# Patient Record
Sex: Female | Born: 1970 | Race: Black or African American | Hispanic: No | Marital: Single | Smoking: Never smoker
Health system: Southern US, Community
[De-identification: ages and names within clinical notes are randomized; demographics above are authoritative.]

## PROBLEM LIST (undated history)

## (undated) DIAGNOSIS — I10 Essential (primary) hypertension: Secondary | ICD-10-CM

## (undated) HISTORY — DX: Essential (primary) hypertension: I10

---

## 1999-03-25 ENCOUNTER — Emergency Department (HOSPITAL_COMMUNITY): Admission: EM | Admit: 1999-03-25 | Discharge: 1999-03-25 | Payer: Self-pay

## 1999-03-25 ENCOUNTER — Encounter: Payer: Self-pay | Admitting: Emergency Medicine

## 2009-02-28 ENCOUNTER — Ambulatory Visit: Payer: Self-pay | Admitting: Family Medicine

## 2010-06-26 ENCOUNTER — Ambulatory Visit: Payer: Self-pay | Admitting: Family Medicine

## 2011-05-28 ENCOUNTER — Ambulatory Visit: Payer: Self-pay | Admitting: Gastroenterology

## 2012-03-10 ENCOUNTER — Ambulatory Visit: Payer: Self-pay | Admitting: Family Medicine

## 2013-08-05 ENCOUNTER — Ambulatory Visit: Payer: Self-pay | Admitting: Family Medicine

## 2013-08-05 LAB — HM MAMMOGRAPHY

## 2014-09-14 DIAGNOSIS — K2100 Gastro-esophageal reflux disease with esophagitis, without bleeding: Secondary | ICD-10-CM | POA: Insufficient documentation

## 2014-09-14 DIAGNOSIS — R51 Headache: Secondary | ICD-10-CM

## 2014-09-14 DIAGNOSIS — R519 Headache, unspecified: Secondary | ICD-10-CM | POA: Insufficient documentation

## 2014-09-14 DIAGNOSIS — F32A Depression, unspecified: Secondary | ICD-10-CM | POA: Insufficient documentation

## 2014-09-14 DIAGNOSIS — Z800 Family history of malignant neoplasm of digestive organs: Secondary | ICD-10-CM | POA: Insufficient documentation

## 2014-09-14 DIAGNOSIS — N912 Amenorrhea, unspecified: Secondary | ICD-10-CM | POA: Insufficient documentation

## 2014-09-14 DIAGNOSIS — B001 Herpesviral vesicular dermatitis: Secondary | ICD-10-CM | POA: Insufficient documentation

## 2014-09-14 DIAGNOSIS — R635 Abnormal weight gain: Secondary | ICD-10-CM | POA: Insufficient documentation

## 2014-09-14 DIAGNOSIS — R7303 Prediabetes: Secondary | ICD-10-CM | POA: Insufficient documentation

## 2014-09-14 DIAGNOSIS — E669 Obesity, unspecified: Secondary | ICD-10-CM | POA: Insufficient documentation

## 2014-09-14 DIAGNOSIS — R739 Hyperglycemia, unspecified: Secondary | ICD-10-CM | POA: Insufficient documentation

## 2014-09-14 DIAGNOSIS — N951 Menopausal and female climacteric states: Secondary | ICD-10-CM | POA: Insufficient documentation

## 2014-09-14 DIAGNOSIS — F419 Anxiety disorder, unspecified: Secondary | ICD-10-CM | POA: Insufficient documentation

## 2014-09-14 DIAGNOSIS — R61 Generalized hyperhidrosis: Secondary | ICD-10-CM | POA: Insufficient documentation

## 2014-09-14 DIAGNOSIS — R002 Palpitations: Secondary | ICD-10-CM | POA: Insufficient documentation

## 2014-09-14 DIAGNOSIS — A048 Other specified bacterial intestinal infections: Secondary | ICD-10-CM | POA: Insufficient documentation

## 2014-09-14 DIAGNOSIS — N62 Hypertrophy of breast: Secondary | ICD-10-CM | POA: Insufficient documentation

## 2014-09-14 DIAGNOSIS — F329 Major depressive disorder, single episode, unspecified: Secondary | ICD-10-CM | POA: Insufficient documentation

## 2014-09-24 ENCOUNTER — Encounter: Payer: Self-pay | Admitting: Physician Assistant

## 2014-09-24 ENCOUNTER — Ambulatory Visit (INDEPENDENT_AMBULATORY_CARE_PROVIDER_SITE_OTHER): Payer: 59 | Admitting: Physician Assistant

## 2014-09-24 VITALS — BP 140/78 | HR 70 | Temp 97.8°F | Resp 16 | Ht 62.0 in | Wt 195.8 lb

## 2014-09-24 DIAGNOSIS — Z01419 Encounter for gynecological examination (general) (routine) without abnormal findings: Secondary | ICD-10-CM

## 2014-09-24 DIAGNOSIS — Z713 Dietary counseling and surveillance: Secondary | ICD-10-CM

## 2014-09-24 DIAGNOSIS — Z1239 Encounter for other screening for malignant neoplasm of breast: Secondary | ICD-10-CM | POA: Diagnosis not present

## 2014-09-24 DIAGNOSIS — Z124 Encounter for screening for malignant neoplasm of cervix: Secondary | ICD-10-CM

## 2014-09-24 DIAGNOSIS — Z6835 Body mass index (BMI) 35.0-35.9, adult: Secondary | ICD-10-CM | POA: Insufficient documentation

## 2014-09-24 MED ORDER — LORCASERIN HCL 10 MG PO TABS: 1.0000 | ORAL_TABLET | Freq: Two times a day (BID) | ORAL | Status: DC

## 2014-09-24 NOTE — Patient Instructions (Addendum)
Preventive Care for Adults A healthy lifestyle and preventive care can promote health and wellness. Preventive health guidelines for women include the following key practices. 1. A routine yearly physical is a good way to check with your health care provider about your health and preventive screening. It is a chance to share any concerns and updates on your health and to receive a thorough exam. 2. Visit your dentist for a routine exam and preventive care every 6 months. Brush your teeth twice a day and floss once a day. Good oral hygiene prevents tooth decay and gum disease. 3. The frequency of eye exams is based on your age, health, family medical history, use of contact lenses, and other factors. Follow your health care provider's recommendations for frequency of eye exams. 4. Eat a healthy diet. Foods like vegetables, fruits, whole grains, low-fat dairy products, and lean protein foods contain the nutrients you need without too many calories. Decrease your intake of foods high in solid fats, added sugars, and salt. Eat the right amount of calories for you.Get information about a proper diet from your health care provider, if necessary. 5. Regular physical exercise is one of the most important things you can do for your health. Most adults should get at least 150 minutes of moderate-intensity exercise (any activity that increases your heart rate and causes you to sweat) each week. In addition, most adults need muscle-strengthening exercises on 2 or more days a week. 6. Maintain a healthy weight. The body mass index (BMI) is a screening tool to identify possible weight problems. It provides an estimate of body fat based on height and weight. Your health care provider can find your BMI and can help you achieve or maintain a healthy weight.For adults 20 years and older: 1. A BMI below 18.5 is considered underweight. 2. A BMI of 18.5 to 24.9 is normal. 3. A BMI of 25 to 29.9 is considered  overweight. 4. A BMI of 30 and above is considered obese. 7. Maintain normal blood lipids and cholesterol levels by exercising and minimizing your intake of saturated fat. Eat a balanced diet with plenty of fruit and vegetables. Blood tests for lipids and cholesterol should begin at age 64 and be repeated every 5 years. If your lipid or cholesterol levels are high, you are over 50, or you are at high risk for heart disease, you may need your cholesterol levels checked more frequently.Ongoing high lipid and cholesterol levels should be treated with medicines if diet and exercise are not working. 8. If you smoke, find out from your health care provider how to quit. If you do not use tobacco, do not start. 9. Lung cancer screening is recommended for adults aged 86-80 years who are at high risk for developing lung cancer because of a history of smoking. A yearly low-dose CT scan of the lungs is recommended for people who have at least a 30-pack-year history of smoking and are a current smoker or have quit within the past 15 years. A pack year of smoking is smoking an average of 1 pack of cigarettes a day for 1 year (for example: 1 pack a day for 30 years or 2 packs a day for 15 years). Yearly screening should continue until the smoker has stopped smoking for at least 15 years. Yearly screening should be stopped for people who develop a health problem that would prevent them from having lung cancer treatment. 10. If you are pregnant, do not drink alcohol. If you are breastfeeding,  be very cautious about drinking alcohol. If you are not pregnant and choose to drink alcohol, do not have more than 1 drink per day. One drink is considered to be 12 ounces (355 mL) of beer, 5 ounces (148 mL) of wine, or 1.5 ounces (44 mL) of liquor. 11. Avoid use of street drugs. Do not share needles with anyone. Ask for help if you need support or instructions about stopping the use of drugs. 12. High blood pressure causes heart  disease and increases the risk of stroke. Your blood pressure should be checked at least every 1 to 2 years. Ongoing high blood pressure should be treated with medicines if weight loss and exercise do not work. 73. If you are 31-39 years old, ask your health care provider if you should take aspirin to prevent strokes. 14. Diabetes screening involves taking a blood sample to check your fasting blood sugar level. This should be done once every 3 years, after age 69, if you are within normal weight and without risk factors for diabetes. Testing should be considered at a younger age or be carried out more frequently if you are overweight and have at least 1 risk factor for diabetes. 15. Breast cancer screening is essential preventive care for women. You should practice "breast self-awareness." This means understanding the normal appearance and feel of your breasts and may include breast self-examination. Any changes detected, no matter how small, should be reported to a health care provider. Women in their 41s and 30s should have a clinical breast exam (CBE) by a health care provider as part of a regular health exam every 1 to 3 years. After age 90, women should have a CBE every year. Starting at age 62, women should consider having a mammogram (breast X-ray test) every year. Women who have a family history of breast cancer should talk to their health care provider about genetic screening. Women at a high risk of breast cancer should talk to their health care providers about having an MRI and a mammogram every year. 16. Breast cancer gene (BRCA)-related cancer risk assessment is recommended for women who have family members with BRCA-related cancers. BRCA-related cancers include breast, ovarian, tubal, and peritoneal cancers. Having family members with these cancers may be associated with an increased risk for harmful changes (mutations) in the breast cancer genes BRCA1 and BRCA2. Results of the assessment will  determine the need for genetic counseling and BRCA1 and BRCA2 testing. 17. Routine pelvic exams to screen for cancer are no longer recommended for nonpregnant women who are considered low risk for cancer of the pelvic organs (ovaries, uterus, and vagina) and who do not have symptoms. Ask your health care provider if a screening pelvic exam is right for you. 71. If you have had past treatment for cervical cancer or a condition that could lead to cancer, you need Pap tests and screening for cancer for at least 20 years after your treatment. If Pap tests have been discontinued, your risk factors (such as having a new sexual partner) need to be reassessed to determine if screening should be resumed. Some women have medical problems that increase the chance of getting cervical cancer. In these cases, your health care provider may recommend more frequent screening and Pap tests. 19. The HPV test is an additional test that may be used for cervical cancer screening. The HPV test looks for the virus that can cause the cell changes on the cervix. The cells collected during the Pap test can be  tested for HPV. The HPV test could be used to screen women aged 63 years and older, and should be used in women of any age who have unclear Pap test results. After the age of 31, women should have HPV testing at the same frequency as a Pap test. 20. Colorectal cancer can be detected and often prevented. Most routine colorectal cancer screening begins at the age of 29 years and continues through age 30 years. However, your health care provider may recommend screening at an earlier age if you have risk factors for colon cancer. On a yearly basis, your health care provider may provide home test kits to check for hidden blood in the stool. Use of a small camera at the end of a tube, to directly examine the colon (sigmoidoscopy or colonoscopy), can detect the earliest forms of colorectal cancer. Talk to your health care provider about  this at age 66, when routine screening begins. Direct exam of the colon should be repeated every 5-10 years through age 59 years, unless early forms of pre-cancerous polyps or small growths are found. 21. People who are at an increased risk for hepatitis B should be screened for this virus. You are considered at high risk for hepatitis B if: 1. You were born in a country where hepatitis B occurs often. Talk with your health care provider about which countries are considered high risk. 2. Your parents were born in a high-risk country and you have not received a shot to protect against hepatitis B (hepatitis B vaccine). 3. You have HIV or AIDS. 4. You use needles to inject street drugs. 5. You live with, or have sex with, someone who has hepatitis B. 6. You get hemodialysis treatment. 7. You take certain medicines for conditions like cancer, organ transplantation, and autoimmune conditions. 22. Hepatitis C blood testing is recommended for all people born from [birth year] through 1965 and any individual with known risks for hepatitis C. 23. Practice safe sex. Use condoms and avoid high-risk sexual practices to reduce the spread of sexually transmitted infections (STIs). STIs include gonorrhea, chlamydia, syphilis, trichomonas, herpes, HPV, and human immunodeficiency virus (HIV). Herpes, HIV, and HPV are viral illnesses that have no cure. They can result in disability, cancer, and death. 24. You should be screened for sexually transmitted illnesses (STIs) including gonorrhea and chlamydia if: 1. You are sexually active and are younger than 24 years. 2. You are older than 24 years and your health care provider tells you that you are at risk for this type of infection. 3. Your sexual activity has changed since you were last screened and you are at an increased risk for chlamydia or gonorrhea. Ask your health care provider if you are at risk. 25. If you are at risk of being infected with HIV, it is recommended  that you take a prescription medicine daily to prevent HIV infection. This is called preexposure prophylaxis (PrEP). You are considered at risk if: 1. You are a heterosexual woman, are sexually active, and are at increased risk for HIV infection. 2. You take drugs by injection. 3. You are sexually active with a partner who has HIV. 4. Talk with your health care provider about whether you are at high risk of being infected with HIV. If you choose to begin PrEP, you should first be tested for HIV. You should then be tested every 3 months for as long as you are taking PrEP. 26. Osteoporosis is a disease in which the bones lose minerals and strength  with aging. This can result in serious bone fractures or breaks. The risk of osteoporosis can be identified using a bone density scan. Women ages 12 years and over and women at risk for fractures or osteoporosis should discuss screening with their health care providers. Ask your health care provider whether you should take a calcium supplement or vitamin D to reduce the rate of osteoporosis. 27. Menopause can be associated with physical symptoms and risks. Hormone replacement therapy is available to decrease symptoms and risks. You should talk to your health care provider about whether hormone replacement therapy is right for you. 28. Use sunscreen. Apply sunscreen liberally and repeatedly throughout the day. You should seek shade when your shadow is shorter than you. Protect yourself by wearing long sleeves, pants, a wide-brimmed hat, and sunglasses year round, whenever you are outdoors. 29. Once a month, do a whole body skin exam, using a mirror to look at the skin on your back. Tell your health care provider of new moles, moles that have irregular borders, moles that are larger than a pencil eraser, or moles that have changed in shape or color. 30. Stay current with required vaccines (immunizations). 1. Influenza vaccine. All adults should be immunized every  year. 2. Tetanus, diphtheria, and acellular pertussis (Td, Tdap) vaccine. Pregnant women should receive 1 dose of Tdap vaccine during each pregnancy. The dose should be obtained regardless of the length of time since the last dose. Immunization is preferred during the 27th-36th week of gestation. An adult who has not previously received Tdap or who does not know her vaccine status should receive 1 dose of Tdap. This initial dose should be followed by tetanus and diphtheria toxoids (Td) booster doses every 10 years. Adults with an unknown or incomplete history of completing a 3-dose immunization series with Td-containing vaccines should begin or complete a primary immunization series including a Tdap dose. Adults should receive a Td booster every 10 years. 3. Varicella vaccine. An adult without evidence of immunity to varicella should receive 2 doses or a second dose if she has previously received 1 dose. Pregnant females who do not have evidence of immunity should receive the first dose after pregnancy. This first dose should be obtained before leaving the health care facility. The second dose should be obtained 4-8 weeks after the first dose. 4. Human papillomavirus (HPV) vaccine. Females aged 13-26 years who have not received the vaccine previously should obtain the 3-dose series. The vaccine is not recommended for use in pregnant females. However, pregnancy testing is not needed before receiving a dose. If a female is found to be pregnant after receiving a dose, no treatment is needed. In that case, the remaining doses should be delayed until after the pregnancy. Immunization is recommended for any person with an immunocompromised condition through the age of 54 years if she did not get any or all doses earlier. During the 3-dose series, the second dose should be obtained 4-8 weeks after the first dose. The third dose should be obtained 24 weeks after the first dose and 16 weeks after the second  dose. 5. Zoster vaccine. One dose is recommended for adults aged 59 years or older unless certain conditions are present. 6. Measles, mumps, and rubella (MMR) vaccine. Adults born before [birth year] generally are considered immune to measles and mumps. Adults born in [birth year] or later should have 1 or more doses of MMR vaccine unless there is a contraindication to the vaccine or there is laboratory evidence of immunity to  each of the three diseases. A routine second dose of MMR vaccine should be obtained at least 28 days after the first dose for students attending postsecondary schools, health care workers, or international travelers. People who received inactivated measles vaccine or an unknown type of measles vaccine during 1963-1967 should receive 2 doses of MMR vaccine. People who received inactivated mumps vaccine or an unknown type of mumps vaccine before 1979 and are at high risk for mumps infection should consider immunization with 2 doses of MMR vaccine. For females of childbearing age, rubella immunity should be determined. If there is no evidence of immunity, females who are not pregnant should be vaccinated. If there is no evidence of immunity, females who are pregnant should delay immunization until after pregnancy. Unvaccinated health care workers born before [birth year] who lack laboratory evidence of measles, mumps, or rubella immunity or laboratory confirmation of disease should consider measles and mumps immunization with 2 doses of MMR vaccine or rubella immunization with 1 dose of MMR vaccine. 7. Pneumococcal 13-valent conjugate (PCV13) vaccine. When indicated, a person who is uncertain of her immunization history and has no record of immunization should receive the PCV13 vaccine. An adult aged 78 years or older who has certain medical conditions and has not been previously immunized should receive 1 dose of PCV13 vaccine. This PCV13 should be followed with a dose of pneumococcal polysaccharide (PPSV23)  vaccine. The PPSV23 vaccine dose should be obtained at least 8 weeks after the dose of PCV13 vaccine. An adult aged 63 years or older who has certain medical conditions and previously received 1 or more doses of PPSV23 vaccine should receive 1 dose of PCV13. The PCV13 vaccine dose should be obtained 1 or more years after the last PPSV23 vaccine dose. 8. Pneumococcal polysaccharide (PPSV23) vaccine. When PCV13 is also indicated, PCV13 should be obtained first. All adults aged 40 years and older should be immunized. An adult younger than age 8 years who has certain medical conditions should be immunized. Any person who resides in a nursing home or long-term care facility should be immunized. An adult smoker should be immunized. People with an immunocompromised condition and certain other conditions should receive both PCV13 and PPSV23 vaccines. People with human immunodeficiency virus (HIV) infection should be immunized as soon as possible after diagnosis. Immunization during chemotherapy or radiation therapy should be avoided. Routine use of PPSV23 vaccine is not recommended for American Indians, [place] Natives, or people younger than 65 years unless there are medical conditions that require PPSV23 vaccine. When indicated, people who have unknown immunization and have no record of immunization should receive PPSV23 vaccine. One-time revaccination 5 years after the first dose of PPSV23 is recommended for people aged 19-64 years who have chronic kidney failure, nephrotic syndrome, asplenia, or immunocompromised conditions. People who received 1-2 doses of PPSV23 before age 64 years should receive another dose of PPSV23 vaccine at age 43 years or later if at least 5 years have passed since the previous dose. Doses of PPSV23 are not needed for people immunized with PPSV23 at or after age 48 years. 9. Meningococcal vaccine. Adults with asplenia or persistent complement component deficiencies should receive 2 doses of  quadrivalent meningococcal conjugate (MenACWY-D) vaccine. The doses should be obtained at least 2 months apart. Microbiologists working with certain meningococcal bacteria, [employer], people at risk during an outbreak, and people who travel to or live in countries with a high rate of meningitis should be immunized. A first-year college student up through age  21 years who is living in a residence hall should receive a dose if she did not receive a dose on or after her 16th birthday. Adults who have certain high-risk conditions should receive one or more doses of vaccine. 10. Hepatitis A vaccine. Adults who wish to be protected from this disease, have certain high-risk conditions, work with hepatitis A-infected animals, work in hepatitis A research labs, or travel to or work in countries with a high rate of hepatitis A should be immunized. Adults who were previously unvaccinated and who anticipate close contact with an international adoptee during the first 60 days after arrival in the [place] from a country with a high rate of hepatitis A should be immunized. 11. Hepatitis B vaccine. Adults who wish to be protected from this disease, have certain high-risk conditions, may be exposed to blood or other infectious body fluids, are household contacts or sex partners of hepatitis B positive people, are clients or workers in certain care facilities, or travel to or work in countries with a high rate of hepatitis B should be immunized. 12. Haemophilus influenzae type b (Hib) vaccine. A previously unvaccinated person with asplenia or sickle cell disease or having a scheduled splenectomy should receive 1 dose of Hib vaccine. Regardless of previous immunization, a recipient of a hematopoietic stem cell transplant should receive a 3-dose series 6-12 months after her successful transplant. Hib vaccine is not recommended for adults with HIV infection. Preventive Services / Frequency Ages 60 to 9  years 1. Blood pressure check.** / Every 1 to 2 years. 2. Lipid and cholesterol check.** / Every 5 years beginning at age 32. 17. Clinical breast exam.** / Every 3 years for women in their 86s and 91s. 4. BRCA-related cancer risk assessment.** / For women who have family members with a BRCA-related cancer (breast, ovarian, tubal, or peritoneal cancers). 5. Pap test.** / Every 2 years from ages 71 through 80. Every 3 years starting at age 3 through age 61 or 58 with a history of 3 consecutive normal Pap tests. 6. HPV screening.** / Every 3 years from ages 69 through ages 47 to 54 with a history of 3 consecutive normal Pap tests. 7. Hepatitis C blood test.** / For any individual with known risks for hepatitis C. 8. Skin self-exam. / Monthly. 9. Influenza vaccine. / Every year. 10. Tetanus, diphtheria, and acellular pertussis (Tdap, Td) vaccine.** / Consult your health care provider. Pregnant women should receive 1 dose of Tdap vaccine during each pregnancy. 1 dose of Td every 10 years. 11. Varicella vaccine.** / Consult your health care provider. Pregnant females who do not have evidence of immunity should receive the first dose after pregnancy. 12. HPV vaccine. / 3 doses over 6 months, if 77 and younger. The vaccine is not recommended for use in pregnant females. However, pregnancy testing is not needed before receiving a dose. 13. Measles, mumps, rubella (MMR) vaccine.** / You need at least 1 dose of MMR if you were born in 1957 or later. You may also need a 2nd dose. For females of childbearing age, rubella immunity should be determined. If there is no evidence of immunity, females who are not pregnant should be vaccinated. If there is no evidence of immunity, females who are pregnant should delay immunization until after pregnancy. 14. Pneumococcal 13-valent conjugate (PCV13) vaccine.** / Consult your health care provider. 15. Pneumococcal polysaccharide (PPSV23) vaccine.** / 1 to 2 doses if you  smoke cigarettes or if you have certain conditions. 16. Meningococcal  vaccine.** / 1 dose if you are age 14 to 59 years and a Market researcher living in a residence hall, or have one of several medical conditions, you need to get vaccinated against meningococcal disease. You may also need additional booster doses. 17. Hepatitis A vaccine.** / Consult your health care provider. 18. Hepatitis B vaccine.** / Consult your health care provider. 19. Haemophilus influenzae type b (Hib) vaccine.** / Consult your health care provider. Ages 73 to 22 years 1. Blood pressure check.** / Every 1 to 2 years. 2. Lipid and cholesterol check.** / Every 5 years beginning at age 89 years. 3. Lung cancer screening. / Every year if you are aged 42-80 years and have a 30-pack-year history of smoking and currently smoke or have quit within the past 15 years. Yearly screening is stopped once you have quit smoking for at least 15 years or develop a health problem that would prevent you from having lung cancer treatment. 4. Clinical breast exam.** / Every year after age 63 years. 5. BRCA-related cancer risk assessment.** / For women who have family members with a BRCA-related cancer (breast, ovarian, tubal, or peritoneal cancers). 6. Mammogram.** / Every year beginning at age 22 years and continuing for as long as you are in good health. Consult with your health care provider. 7. Pap test.** / Every 3 years starting at age 50 years through age 61 or 22 years with a history of 3 consecutive normal Pap tests. 8. HPV screening.** / Every 3 years from ages 36 years through ages 49 to 63 years with a history of 3 consecutive normal Pap tests. 9. Fecal occult blood test (FOBT) of stool. / Every year beginning at age 21 years and continuing until age 4 years. You may not need to do this test if you get a colonoscopy every 10 years. 10. Flexible sigmoidoscopy or colonoscopy.** / Every 5 years for a flexible sigmoidoscopy or  every 10 years for a colonoscopy beginning at age 9 years and continuing until age 42 years. 11. Hepatitis C blood test.** / For all people born from [birth year] through 1965 and any individual with known risks for hepatitis C. 12. Skin self-exam. / Monthly. 13. Influenza vaccine. / Every year. 14. Tetanus, diphtheria, and acellular pertussis (Tdap/Td) vaccine.** / Consult your health care provider. Pregnant women should receive 1 dose of Tdap vaccine during each pregnancy. 1 dose of Td every 10 years. 15. Varicella vaccine.** / Consult your health care provider. Pregnant females who do not have evidence of immunity should receive the first dose after pregnancy. 16. Zoster vaccine.** / 1 dose for adults aged 41 years or older. 17. Measles, mumps, rubella (MMR) vaccine.** / You need at least 1 dose of MMR if you were born in 1957 or later. You may also need a 2nd dose. For females of childbearing age, rubella immunity should be determined. If there is no evidence of immunity, females who are not pregnant should be vaccinated. If there is no evidence of immunity, females who are pregnant should delay immunization until after pregnancy. 18. Pneumococcal 13-valent conjugate (PCV13) vaccine.** / Consult your health care provider. 19. Pneumococcal polysaccharide (PPSV23) vaccine.** / 1 to 2 doses if you smoke cigarettes or if you have certain conditions. 20. Meningococcal vaccine.** / Consult your health care provider. 21. Hepatitis A vaccine.** / Consult your health care provider. 22. Hepatitis B vaccine.** / Consult your health care provider. 23. Haemophilus influenzae type b (Hib) vaccine.** / Consult your health care provider. Ages  65 years and over 1. Blood pressure check.** / Every 1 to 2 years. 2. Lipid and cholesterol check.** / Every 5 years beginning at age 33 years. 3. Lung cancer screening. / Every year if you are aged 40-80 years and have a 30-pack-year history of smoking and currently smoke or  have quit within the past 15 years. Yearly screening is stopped once you have quit smoking for at least 15 years or develop a health problem that would prevent you from having lung cancer treatment. 4. Clinical breast exam.** / Every year after age 24 years. 5. BRCA-related cancer risk assessment.** / For women who have family members with a BRCA-related cancer (breast, ovarian, tubal, or peritoneal cancers). 6. Mammogram.** / Every year beginning at age 30 years and continuing for as long as you are in good health. Consult with your health care provider. 7. Pap test.** / Every 3 years starting at age 45 years through age 57 or 68 years with 3 consecutive normal Pap tests. Testing can be stopped between 65 and 70 years with 3 consecutive normal Pap tests and no abnormal Pap or HPV tests in the past 10 years. 8. HPV screening.** / Every 3 years from ages 55 years through ages 53 or 61 years with a history of 3 consecutive normal Pap tests. Testing can be stopped between 65 and 70 years with 3 consecutive normal Pap tests and no abnormal Pap or HPV tests in the past 10 years. 9. Fecal occult blood test (FOBT) of stool. / Every year beginning at age 77 years and continuing until age 41 years. You may not need to do this test if you get a colonoscopy every 10 years. 10. Flexible sigmoidoscopy or colonoscopy.** / Every 5 years for a flexible sigmoidoscopy or every 10 years for a colonoscopy beginning at age 27 years and continuing until age 48 years. 11. Hepatitis C blood test.** / For all people born from [birth year] through 1963 and any individual with known risks for hepatitis C. 12. Osteoporosis screening.** / A one-time screening for women ages 56 years and over and women at risk for fractures or osteoporosis. 13. Skin self-exam. / Monthly. 14. Influenza vaccine. / Every year. 15. Tetanus, diphtheria, and acellular pertussis (Tdap/Td) vaccine.** / 1 dose of Td every 10 years. 16. Varicella vaccine.** /  Consult your health care provider. 17. Zoster vaccine.** / 1 dose for adults aged 77 years or older. 18. Pneumococcal 13-valent conjugate (PCV13) vaccine.** / Consult your health care provider. 19. Pneumococcal polysaccharide (PPSV23) vaccine.** / 1 dose for all adults aged 23 years and older. 20. Meningococcal vaccine.** / Consult your health care provider. 21. Hepatitis A vaccine.** / Consult your health care provider. 22. Hepatitis B vaccine.** / Consult your health care provider. 23. Haemophilus influenzae type b (Hib) vaccine.** / Consult your health care provider. ** Family history and personal history of risk and conditions may change your health care provider's recommendations. Document Released: 05/01/2001 Document Revised: 07/20/2013 Document Reviewed: 07/31/2010 [facility] Patient Information 2015 [place], [place]. This information is not intended to replace advice given to you by your health care provider. Make sure you discuss any questions you have with your health care provider.     Why follow it? Research shows. . Those who follow the Mediterranean diet have a reduced risk of heart disease  . The diet is associated with a reduced incidence of Parkinson's and Alzheimer's diseases . People following the diet may have longer life expectancies and lower rates of chronic  diseases  . The Dietary Guidelines for Americans recommends the Mediterranean diet as an eating plan to promote health and prevent disease  What Is the Mediterranean Diet?  . Healthy eating plan based on typical foods and recipes of Mediterranean-style cooking . The diet is primarily a plant based diet; these foods should make up a majority of meals   Starches - Plant based foods should make up a majority of meals - They are an important sources of vitamins, minerals, energy, antioxidants, and fiber - Choose whole grains, foods high in fiber and minimally processed items  - Typical grain sources include wheat, oats,  barley, corn, brown rice, bulgar, farro, millet, polenta, couscous  - Various types of beans include chickpeas, lentils, fava beans, black beans, white beans   Fruits  Veggies - Large quantities of antioxidant rich fruits & veggies; 6 or more servings  - Vegetables can be eaten raw or lightly drizzled with oil and cooked  - Vegetables common to the traditional Mediterranean Diet include: artichokes, arugula, beets, broccoli, brussel sprouts, cabbage, carrots, celery, collard greens, cucumbers, eggplant, kale, leeks, lemons, lettuce, mushrooms, okra, onions, peas, peppers, potatoes, pumpkin, radishes, rutabaga, shallots, spinach, sweet potatoes, turnips, zucchini - Fruits common to the Mediterranean Diet include: apples, apricots, avocados, cherries, clementines, dates, figs, grapefruits, grapes, melons, nectarines, oranges, peaches, pears, pomegranates, strawberries, tangerines  Fats - Replace butter and margarine with healthy oils, such as olive oil, canola oil, and tahini  - Limit nuts to no more than a handful a day  - Nuts include walnuts, almonds, pecans, pistachios, pine nuts  - Limit or avoid candied, honey roasted or heavily salted nuts - Olives are central to the [employer] - can be eaten whole or used in a variety of dishes   Meats Protein - Limiting red meat: no more than a few times a month - When eating red meat: choose lean cuts and keep the portion to the size of deck of cards - Eggs: approx. 0 to 4 times a week  - Fish and lean poultry: at least 2 a week  - Healthy protein sources include, chicken, [place], lean beef, lamb - Increase intake of seafood such as tuna, salmon, trout, mackerel, shrimp, scallops - Avoid or limit high fat processed meats such as sausage and bacon  Dairy - Include moderate amounts of low fat dairy products  - Focus on healthy dairy such as fat free yogurt, skim milk, low or reduced fat cheese - Limit dairy products higher in fat such as whole or 2%  milk, cheese, ice cream  Alcohol - Moderate amounts of red wine is ok  - No more than 5 oz daily for women (all ages) and men older than age 64  - No more than 10 oz of wine daily for men younger than 20  Other - Limit sweets and other desserts  - Use herbs and spices instead of salt to flavor foods  - Herbs and spices common to the traditional Mediterranean Diet include: basil, bay leaves, chives, cloves, cumin, fennel, garlic, lavender, marjoram, mint, oregano, parsley, pepper, rosemary, sage, savory, sumac, tarragon, thyme   It's not just a diet, it's a lifestyle:  . The Mediterranean diet includes lifestyle factors typical of those in the region  . Foods, drinks and meals are best eaten with others and savored . Daily physical activity is important for overall good health . This could be strenuous exercise like running and aerobics . This could also be more leisurely  activities such as walking, housework, yard-work, or taking the stairs . Moderation is the key; a balanced and healthy diet accommodates most foods and drinks . Consider portion sizes and frequency of consumption of certain foods   Meal Ideas & Options:  . Breakfast:  o Whole wheat toast or whole wheat English muffins with peanut butter & hard boiled egg o Steel cut oats topped with apples & cinnamon and skim milk  o Fresh fruit: banana, strawberries, melon, berries, peaches  o Smoothies: strawberries, bananas, greek yogurt, peanut butter o Low fat greek yogurt with blueberries and granola  o Egg white omelet with spinach and mushrooms o Breakfast couscous: whole wheat couscous, apricots, skim milk, cranberries  . Sandwiches:  o Hummus and grilled vegetables (peppers, zucchini, squash) on whole wheat bread   o Grilled chicken on whole wheat pita with lettuce, tomatoes, cucumbers or tzatziki  o Tuna salad on whole wheat bread: tuna salad made with greek yogurt, olives, red peppers, capers, green onions o Garlic rosemary  lamb pita: lamb sauted with garlic, rosemary, salt & pepper; add lettuce, cucumber, greek yogurt to pita - flavor with lemon juice and black pepper  . Seafood:  o Mediterranean grilled salmon, seasoned with garlic, basil, parsley, lemon juice and black pepper o Shrimp, lemon, and spinach whole-grain pasta salad made with low fat greek yogurt  o Seared scallops with lemon orzo  o Seared tuna steaks seasoned salt, pepper, coriander topped with tomato mixture of olives, tomatoes, olive oil, minced garlic, parsley, green onions and cappers  . Meats:  o Herbed greek chicken salad with kalamata olives, cucumber, feta  o Red bell peppers stuffed with spinach, bulgur, lean ground beef (or lentils) & topped with feta   o Kebabs: skewers of chicken, tomatoes, onions, zucchini, squash  o [place] burgers: made with red onions, mint, dill, lemon juice, feta cheese topped with roasted red peppers . Vegetarian o Cucumber salad: cucumbers, artichoke hearts, celery, red onion, feta cheese, tossed in olive oil & lemon juice  o Hummus and whole grain pita points with a greek salad (lettuce, tomato, feta, olives, cucumbers, red onion) o Lentil soup with celery, carrots made with vegetable broth, garlic, salt and pepper  o Tabouli salad: parsley, bulgur, mint, scallions, cucumbers, tomato, radishes, lemon juice, olive oil, salt and pepper.      American Heart Association (AHA) Exercise Recommendation  Being physically active is important to prevent heart disease and stroke, the nation's No. 1and No. 5killers. To improve overall cardiovascular health, we suggest at least 150 minutes per week of moderate exercise or 75 minutes per week of vigorous exercise (or a combination of moderate and vigorous activity). Thirty minutes a day, five times a week is an easy goal to remember. You will also experience benefits even if you divide your time into two or three segments of 10 to 15 minutes per day.  For people who would  benefit from lowering their blood pressure or cholesterol, we recommend 40 minutes of aerobic exercise of moderate to vigorous intensity three to four times a week to lower the risk for heart attack and stroke.  Physical activity is anything that makes you move your body and burn calories.  This includes things like climbing stairs or playing sports. Aerobic exercises benefit your heart, and include walking, jogging, swimming or biking. Strength and stretching exercises are best for overall stamina and flexibility.  The simplest, positive change you can make to effectively improve your heart health is to start  walking. It's enjoyable, free, easy, social and great exercise. A walking program is flexible and boasts high success rates because people can stick with it. It's easy for walking to become a regular and satisfying part of life.   For Overall Cardiovascular Health:  At least 30 minutes of moderate-intensity aerobic activity at least 5 days per week for a total of 150  OR   At least 25 minutes of vigorous aerobic activity at least 3 days per week for a total of 75 minutes; or a combination of moderate- and vigorous-intensity aerobic activity  AND   Moderate- to high-intensity muscle-strengthening activity at least 2 days per week for additional health benefits.  For Lowering Blood Pressure and Cholesterol  An average 40 minutes of moderate- to vigorous-intensity aerobic activity 3 or 4 times per week  What if I can't make it to the time goal? Something is always better than nothing! And everyone has to start somewhere. Even if you've been sedentary for years, today is the day you can begin to make healthy changes in your life. If you don't think you'll make it for 30 or 40 minutes, set a reachable goal for today. You can work up toward your overall goal by increasing your time as you get stronger. Don't let all-or-nothing thinking rob you of doing what you can every day.   Source:http://www.heart.org

## 2014-09-24 NOTE — Progress Notes (Signed)
Subjective:     Patient ID: Anna [name], female   DOB: 09/09/1970, 44 y.o.   MRN: [id number]  HPI  Anna [name] is a 44-year-old female that returns to the office today for a gynecological exam. She states she has been doing well and denies any discharge or lesions. Her last menstrual period was February 2016. She does mention that about 1 month ago she did have an episode of bleeding that lasted for approximately 3 hours and has not had any incidents of bleeding since. She is also here for screening breast exam. She does have a positive family history of breast cancer in her mother and her maternal aunt. We did discuss the possibility of BRCA testing in her future due to the positive family history and since she is a [employer] employee. She is going to check on this with her supervisor. She also has complaints of weight gain. She states that over the last couple of years she has gradually put on more more weight and feels that it is due to stress. She is interested in losing weight and has been working out with a personal trainer and trying to diet appropriately.   Review of Systems  Constitutional: Negative.   HENT: Negative.   Eyes: Negative.   Respiratory: Negative.   Cardiovascular: Negative.   Gastrointestinal: Negative.   Endocrine: Negative.   Genitourinary: Negative.   Musculoskeletal: Negative.   Skin: Negative.   Allergic/Immunologic: Negative.   Neurological: Negative.   Hematological: Negative.   Psychiatric/Behavioral: Negative.        Objective:   Physical Exam  Constitutional: She appears well-developed and well-nourished. No distress.  Pulmonary/Chest: Right breast exhibits no inverted nipple, no mass, no nipple discharge, no skin change and no tenderness. Left breast exhibits no inverted nipple, no mass, no nipple discharge, no skin change and no tenderness. Breasts are symmetrical.  Abdominal: Hernia confirmed negative in the right inguinal area and confirmed negative in  the left inguinal area.  Genitourinary: Rectum normal and vagina normal. No breast swelling, tenderness, discharge or bleeding. Pelvic exam was performed with patient supine. There is no rash, tenderness or lesion on the right labia. There is no rash, tenderness or lesion on the left labia. Uterus is deviated (Cervix was deviated to the right and was difficult to visualize completely). Cervix exhibits no motion tenderness, no discharge and no friability. Right adnexum displays no mass, no tenderness and no fullness. Left adnexum displays no mass, no tenderness and no fullness. No erythema or tenderness in the vagina. No vaginal discharge found.  Lymphadenopathy:       Right: No inguinal adenopathy present.       Left: No inguinal adenopathy present.  Skin: She is not diaphoretic.  Vitals reviewed.      Assessment:     1. Encounter for routine gynecological examination   2. Screening for malignant neoplasm of cervix   3. Breast screening   4. Weight loss counseling, encounter for   5. BMI 35.0-35.9,adult       Plan:     1. Encounter for routine gynecological examination - Pap IG, CT/NG w/ reflex HPV when [facility] ([facility])  2. Screening for malignant neoplasm of cervix - Pap IG, CT/NG w/ reflex HPV when [facility] ([facility])  3. Breast screening Physical exam today was normal there were no masses, lumps, lesions, skin changes, or asymmetry noted on exam. She does have a positive family history of breast cancer in her mother   developing breast cancer in her 40s and her maternal aunt developing breast cancer in her early 57s. Her aunt did pass away from breast cancer. We did discuss possible genetic testing for her to make sure that she is not BRCA positive. She is interested in possibly doing this but would like to discuss this with her employer as she works for [employer]. and may be able to get this testing for a discounted price or possibly free. She will call the office if she  would like to proceed with getting tested and we will get that ordered for her.  We'll proceed with annual screening mammogram. - MM DIGITAL SCREENING BILATERAL; Future  4. Weight loss counseling, encounter for We discussed continuing diet and exercise as she has been doing. I did give her some information on the Mediterranean diet for her to consider as it is not a restrictive diet, but yet a healthy lifestyle change.  I also did recommend for her to stick to a 1500-calorie diet. We also discussed adding exercise to her daily routine. She has recently gotten a Physiological scientist and has been trying to increase her physical activity. She does agree that she would like to try to exercise at least 5 days a week. We also discussed the addition of appetite suppressants to help with her weight loss goals. She is interested in trying the Belviq. We will try Belviq for 2 months. I will have her return in 2 months for a weight check and to reevaluate with how she is doing with her weight loss goals. At that time we will decide if we will continue Belviq. - Lorcaserin HCl 10 MG TABS; Take 1 tablet by mouth 2 (two) times daily.  Dispense: 60 tablet; Refill: 1  5. BMI 35.0-35.9,adult See above plan. - Lorcaserin HCl 10 MG TABS; Take 1 tablet by mouth 2 (two) times daily.  Dispense: 60 tablet; Refill: 1

## 2014-09-27 LAB — PAP IG, CT-NG, RFX HPV ASCU: PAP Smear Comment: 0

## 2014-09-28 ENCOUNTER — Telehealth: Payer: Self-pay

## 2014-09-28 NOTE — Telephone Encounter (Signed)
Left a detailed message on cell voicemail advising patient that pap results were normal.

## 2014-09-28 NOTE — Telephone Encounter (Signed)
-----   Message from Mar Daring, [place] sent at 09/27/2014  4:55 PM EDT ----- Normal pap

## 2014-11-25 ENCOUNTER — Ambulatory Visit: Payer: Self-pay | Admitting: Physician Assistant

## 2015-01-12 ENCOUNTER — Encounter: Payer: Self-pay | Admitting: Physician Assistant

## 2015-01-12 ENCOUNTER — Ambulatory Visit (INDEPENDENT_AMBULATORY_CARE_PROVIDER_SITE_OTHER): Payer: 59 | Admitting: Physician Assistant

## 2015-01-12 DIAGNOSIS — N926 Irregular menstruation, unspecified: Secondary | ICD-10-CM | POA: Diagnosis not present

## 2015-01-12 DIAGNOSIS — Z6835 Body mass index (BMI) 35.0-35.9, adult: Secondary | ICD-10-CM

## 2015-01-12 DIAGNOSIS — Z713 Dietary counseling and surveillance: Secondary | ICD-10-CM | POA: Diagnosis not present

## 2015-01-12 DIAGNOSIS — Z8041 Family history of malignant neoplasm of ovary: Secondary | ICD-10-CM | POA: Diagnosis not present

## 2015-01-12 MED ORDER — LORCASERIN HCL 10 MG PO TABS: 1.0000 | ORAL_TABLET | Freq: Two times a day (BID) | ORAL | Status: DC

## 2015-01-12 NOTE — Patient Instructions (Signed)
Calorie Counting for Weight Loss Calories are energy you get from the things you eat and drink. Your body uses this energy to keep you going throughout the day. The number of calories you eat affects your weight. When you eat more calories than your body needs, your body stores the extra calories as fat. When you eat fewer calories than your body needs, your body burns fat to get the energy it needs. Calorie counting means keeping track of how many calories you eat and drink each day. If you make sure to eat fewer calories than your body needs, you should lose weight. In order for calorie counting to work, you will need to eat the number of calories that are right for you in a day to lose a healthy amount of weight per week. A healthy amount of weight to lose per week is usually 1-2 lb (0.5-0.9 kg). A dietitian can determine how many calories you need in a day and give you suggestions on how to reach your calorie goal.  WHAT IS MY MY PLAN? My goal is to have 1200 calories per day.  If I have this many calories per day, I should lose around 1-2 pounds per week. WHAT DO I NEED TO KNOW ABOUT CALORIE COUNTING? In order to meet your daily calorie goal, you will need to:  Find out how many calories are in each food you would like to eat. Try to do this before you eat.  Decide how much of the food you can eat.  Write down what you ate and how many calories it had. Doing this is called keeping a food log. WHERE DO I FIND CALORIE INFORMATION? The number of calories in a food can be found on a Nutrition Facts label. Note that all the information on a label is based on a specific serving of the food. If a food does not have a Nutrition Facts label, try to look up the calories online or ask your dietitian for help. HOW DO I DECIDE HOW MUCH TO EAT? To decide how much of the food you can eat, you will need to consider both the number of calories in one serving and the size of one serving. This information can be  found on the Nutrition Facts label. If a food does not have a Nutrition Facts label, look up the information online or ask your dietitian for help. Remember that calories are listed per serving. If you choose to have more than one serving of a food, you will have to multiply the calories per serving by the amount of servings you plan to eat. For example, the label on a package of bread might say that a serving size is 1 slice and that there are 90 calories in a serving. If you eat 1 slice, you will have eaten 90 calories. If you eat 2 slices, you will have eaten 180 calories. HOW DO I KEEP A FOOD LOG? After each meal, record the following information in your food log:  What you ate.  How much of it you ate.  How many calories it had.  Then, add up your calories. Keep your food log near you, such as in a small notebook in your pocket. Another option is to use a mobile app or website. Some programs will calculate calories for you and show you how many calories you have left each time you add an item to the log. WHAT ARE SOME CALORIE COUNTING TIPS?  Use your calories on foods   and drinks that will fill you up and not leave you hungry. Some examples of this include foods like nuts and nut butters, vegetables, lean proteins, and high-fiber foods (more than 5 g fiber per serving).  Eat nutritious foods and avoid empty calories. Empty calories are calories you get from foods or beverages that do not have many nutrients, such as candy and soda. It is better to have a nutritious high-calorie food (such as an avocado) than a food with few nutrients (such as a bag of chips).  Know how many calories are in the foods you eat most often. This way, you do not have to look up how many calories they have each time you eat them.  Look out for foods that may seem like low-calorie foods but are really high-calorie foods, such as baked goods, soda, and fat-free candy.  Pay attention to calories in drinks. Drinks  such as sodas, specialty coffee drinks, alcohol, and juices have a lot of calories yet do not fill you up. Choose low-calorie drinks like water and diet drinks.  Focus your calorie counting efforts on higher calorie items. Logging the calories in a garden salad that contains only vegetables is less important than calculating the calories in a milk shake.  Find a way of tracking calories that works for you. Get creative. Most people who are successful find ways to keep track of how much they eat in a day, even if they do not count every calorie. WHAT ARE SOME PORTION CONTROL TIPS?  Know how many calories are in a serving. This will help you know how many servings of a certain food you can have.  Use a measuring cup to measure serving sizes. This is helpful when you start out. With time, you will be able to estimate serving sizes for some foods.  Take some time to put servings of different foods on your favorite plates, bowls, and cups so you know what a serving looks like.  Try not to eat straight from a bag or box. Doing this can lead to overeating. Put the amount you would like to eat in a cup or on a plate to make sure you are eating the right portion.  Use smaller plates, glasses, and bowls to prevent overeating. This is a quick and easy way to practice portion control. If your plate is smaller, less food can fit on it.  Try not to multitask while eating, such as watching TV or using your computer. If it is time to eat, sit down at a table and enjoy your food. Doing this will help you to start recognizing when you are full. It will also make you more aware of what and how much you are eating. HOW CAN I CALORIE COUNT WHEN EATING OUT?  Ask for smaller portion sizes or child-sized portions.  Consider sharing an entree and sides instead of getting your own entree.  If you get your own entree, eat only half. Ask for a box at the beginning of your meal and put the rest of your entree in it so  you are not tempted to eat it.  Look for the calories on the menu. If calories are listed, choose the lower calorie options.  Choose dishes that include vegetables, fruits, whole grains, low-fat dairy products, and lean protein. Focusing on smart food choices from each of the 5 food groups can help you stay on track at restaurants.  Choose items that are boiled, broiled, grilled, or steamed.  Choose   water, milk, unsweetened iced tea, or other drinks without added sugars. If you want an alcoholic beverage, choose a lower calorie option. For example, a regular margarita can have up to 700 calories and a glass of wine has around 150.  Stay away from items that are buttered, battered, fried, or served with cream sauce. Items labeled "crispy" are usually fried, unless stated otherwise.  Ask for dressings, sauces, and syrups on the side. These are usually very high in calories, so do not eat much of them.  Watch out for salads. Many people think salads are a healthy option, but this is often not the case. Many salads come with bacon, fried chicken, lots of cheese, fried chips, and dressing. All of these items have a lot of calories. If you want a salad, choose a garden salad and ask for grilled meats or steak. Ask for the dressing on the side, or ask for olive oil and vinegar or lemon to use as dressing.  Estimate how many servings of a food you are given. For example, a serving of cooked rice is  cup or about the size of half a tennis ball or one cupcake wrapper. Knowing serving sizes will help you be aware of how much food you are eating at restaurants. The list below tells you how big or small some common portion sizes are based on everyday objects.  1 oz--4 stacked dice.  3 oz--1 deck of cards.  1 tsp--1 dice.  1 Tbsp-- a Ping-Pong ball.  2 Tbsp--1 Ping-Pong ball.   cup--1 tennis ball or 1 cupcake wrapper.  1 cup--1 baseball.   This information is not intended to replace advice given  to you by your health care provider. Make sure you discuss any questions you have with your health care provider.   Document Released: 03/05/2005 Document Revised: 03/26/2014 Document Reviewed: 01/08/2013 Elsevier Interactive Patient Education 2016 [employer]. Exercising to [employer] Exercising can help you to lose weight. In order to lose weight through exercise, you need to do vigorous-intensity exercise. You can tell that you are exercising with vigorous intensity if you are breathing very hard and fast and cannot hold a conversation while exercising. Moderate-intensity exercise helps to maintain your current weight. You can tell that you are exercising at a moderate level if you have a higher heart rate and faster breathing, but you are still able to hold a conversation. HOW OFTEN SHOULD I EXERCISE? Choose an activity that you enjoy and set realistic goals. Your health care provider can help you to make an activity plan that works for you. Exercise regularly as directed by your health care provider. This may include:  Doing resistance training twice each week, such as:  Push-ups.  Sit-ups.  Lifting weights.  Using resistance bands.  Doing a given intensity of exercise for a given amount of time. Choose from these options:  150 minutes of moderate-intensity exercise every week.  75 minutes of vigorous-intensity exercise every week.  A mix of moderate-intensity and vigorous-intensity exercise every week. Children, pregnant women, people who are out of shape, people who are overweight, and older adults may need to consult a health care provider for individual recommendations. If you have any sort of medical condition, be sure to consult your health care provider before starting a new exercise program. WHAT ARE SOME ACTIVITIES THAT CAN HELP ME TO LOSE WEIGHT?   Walking at a rate of at least 4.5 miles an hour.  Jogging or running at a rate of  5 miles per hour.  Biking at a rate  of at least 10 miles per hour.  Lap swimming.  Roller-skating or in-line skating.  Cross-country skiing.  Vigorous competitive sports, such as football, basketball, and soccer.  Jumping rope.  Aerobic dancing. HOW CAN I BE MORE ACTIVE IN MY DAY-TO-DAY ACTIVITIES?  Use the stairs instead of the elevator.  Take a walk during your lunch break.  If you drive, park your car farther away from work or school.  If you take public transportation, get off one stop early and walk the rest of the way.  Make all of your phone calls while standing up and walking around.  Get up, stretch, and walk around every 30 minutes throughout the day. WHAT GUIDELINES SHOULD I FOLLOW WHILE EXERCISING?  Do not exercise so much that you hurt yourself, feel dizzy, or get very short of breath.  Consult your health care provider prior to starting a new exercise program.  Wear comfortable clothes and shoes with good support.  Drink plenty of water while you exercise to prevent dehydration or heat stroke. Body water is lost during exercise and must be replaced.  Work out until you breathe faster and your heart beats faster.   This information is not intended to replace advice given to you by your health care provider. Make sure you discuss any questions you have with your health care provider.   Document Released: 04/07/2010 Document Revised: 03/26/2014 Document Reviewed: 08/06/2013 Elsevier Interactive Patient Education [employer].

## 2015-01-13 NOTE — Progress Notes (Signed)
Patient ID: LYNE KHURANA, female   DOB: 1970-04-04, 44 y.o.   MRN: [id number]  Name: ARLOA PRAK   MRN: [id number]    DOB: [birth year]   Date:01/13/2015       Progress Note  Subjective  Chief Complaint  Chief Complaint  Patient presents with  . Obesity    Patient comes in office today to address weight loss. Patient states that she is an employee of [employer] and is trying to appeal insurance due to BMI. Patient states that she was previously on Belviq but discontinued due to headaches, she would like to address some questions in regards to medication and to have it filled today. Patient states that she is actively involved in the gym 2-3x a week.     HPI  Roshini Fulwider is a 44 year old female that comes in to the office today to discuss weight loss. We had previously discussed weight loss and I had prescribed her Belviq. She took it one day and stated that she had some daytime somnolence with it so she did not take it after that. She wants the discussed this medication again and ask some questions to make sure that it is okay for her to take still. She has been exercising regularly and states that has been helping. She feels that with some appetite suppression she will really be able to make progress.  No problem-specific assessment & plan notes found for this encounter.   Past Medical History  Diagnosis Date  . Hypertension     Social History  Substance Use Topics  . Smoking status: Never Smoker   . Smokeless tobacco: Not on file  . Alcohol Use: 0.0 oz/week    0 Standard drinks or equivalent per week     Comment: OCCASIONALLY     Current outpatient prescriptions:  .  metoprolol succinate (TOPROL-XL) 50 MG 24 hr tablet, Take 1 tablet by mouth daily., Disp: , Rfl:  .  Lorcaserin HCl 10 MG TABS, Take 1 tablet by mouth 2 (two) times daily., Disp: 60 tablet, Rfl: 1  No Known Allergies  Review of Systems  Constitutional: Negative.   HENT: Negative.   Eyes: Negative.   Respiratory:  Negative.   Cardiovascular: Negative.   Gastrointestinal: Negative.   Genitourinary: Negative.   Musculoskeletal: Negative.   Skin: Negative.   Neurological: Negative.   Endo/Heme/Allergies: Negative.   Psychiatric/Behavioral: Negative.     Objective  Filed Vitals:   01/12/15 1632  BP: 122/84  Pulse: 78  Temp: 98.4 F (36.9 C)  TempSrc: Oral  Resp: 16  Height: $Remove'5\' 2"'[id number]$  (1.575 m)  Weight: 200 lb 12.8 oz (91.082 kg)     Physical Exam  Constitutional: She is well-developed, well-nourished, and in no distress. No distress.  Cardiovascular: Normal rate, regular rhythm and normal heart sounds.  Exam reveals no gallop and no friction rub.   No murmur heard. Pulmonary/Chest: Effort normal and breath sounds normal. No respiratory distress. She has no wheezes. She has no rales.  Neurological: She is alert.  Skin: She is not diaphoretic.  Psychiatric: Mood, memory, affect and judgment normal.  Vitals reviewed.   No results found for this or any previous visit (from the past 2160 hour(s)).   Assessment & Plan  1. Weight loss counseling, encounter for She will restart Belviq as below. I did advise hers that since she had the fatigue with taking it that she should try to take it at night. She will try this and will call me if  she is still unable to tolerate side effects. We discussed starting a food diary as well. I did recommend for her to try to stick to a 1200-1500-calorie diet. We will she for a goal of trying to lose 1-2 pounds per week. I will see her back in 4 weeks for weight recheck and to see how she is doing. - Lorcaserin HCl 10 MG TABS; Take 1 tablet by mouth 2 (two) times daily.  Dispense: 60 tablet; Refill: 1  2. BMI 35.0-35.9,adult See above medical treatment plan. - Lorcaserin HCl 10 MG TABS; Take 1 tablet by mouth 2 (two) times daily.  Dispense: 60 tablet; Refill: 1  3. Abnormal menses During evaluation she also happened to mention that she has had an abnormal  menstrual cycle for the last couple months. She states that her mother had went through menopause at an early age and she is curious if she may be going through menopause now as well. We will check labs as below. I will follow-up with her pending lab results. I will also be seeing her back in 4 weeks for her weight recheck if her labs are normal. - LH - FSH - Estrogens, Total  4. Family history of ovarian cancer Her older sister was recently diagnosed with ovarian cancer. She is 42 years old. She states that her sister complained of abdominal pain for approximately a year before finally going to have it evaluated. Upon evaluation this was where the ovarian cancer was found. She also has a positive family history of breast cancer in her mother. Neither had genetic testing for BRCA1 or 2. I told her that being that she works for [employer]. we could get a CA 125 which is a marker for ovarian cancer. I will follow-up with her pending lab results or in 4 weeks if this is normal. - CA 125

## 2015-01-27 ENCOUNTER — Ambulatory Visit (INDEPENDENT_AMBULATORY_CARE_PROVIDER_SITE_OTHER): Payer: 59 | Admitting: Family Medicine

## 2015-01-27 ENCOUNTER — Telehealth: Payer: Self-pay | Admitting: Physician Assistant

## 2015-01-27 ENCOUNTER — Telehealth: Payer: Self-pay

## 2015-01-27 ENCOUNTER — Encounter: Payer: Self-pay | Admitting: Family Medicine

## 2015-01-27 VITALS — BP 142/84 | HR 64 | Temp 98.3°F | Resp 16 | Wt 200.2 lb

## 2015-01-27 DIAGNOSIS — N921 Excessive and frequent menstruation with irregular cycle: Secondary | ICD-10-CM | POA: Diagnosis not present

## 2015-01-27 MED ORDER — MEDROXYPROGESTERONE ACETATE 10 MG PO TABS: 10.0000 mg | ORAL_TABLET | Freq: Every day | ORAL | Status: DC

## 2015-01-27 NOTE — Patient Instructions (Signed)

## 2015-01-27 NOTE — Telephone Encounter (Signed)
Patient reports that she has not had a period since February (9 months ago). She reports that last night her period just began, and this morning she says that it is really heavy and has a lot of clots. She is beginning to feel really dizzy and weak due to all the blood loss. She reports that she knows that she is not pregnant. She is not currently on birth control. She is concerned that she may have fibroids. Advised patient to come in and be seen. She is scheduled for today at 11a.

## 2015-01-27 NOTE — Progress Notes (Signed)
Patient ID: Anna [name], female   DOB: Jul 27, 1970, 44 y.o.   MRN: [id number] Name: Anna [name]   MRN: [id number]    DOB: July 11, 1970   Date:01/27/2015      Progress Note  Subjective  Chief Complaint  Chief Complaint  Patient presents with  . Menorrhagia    HPI  This 44 year old female had regular monthly menses until February 2016. No menses and some hot flashes until period restarted 3 days ago. Normal PAP and exam on 09-24-14.   Menorrhagia: Patient reports heavy menstrual cycle that started on 01/24/2015. Symptoms include: clotting, cramping, nausea, and dizziness onset in the past 12-24 hours. Soaking 4-5 pads a day.     Past Surgical History  Procedure Laterality Date  . No past surgeries     Family History  Problem Relation Age of Onset  . Stroke Mother   . Hypertension Mother   . Heart attack Mother   . Breast cancer Mother   . Colon cancer Father   . COPD Father   . Hypertension Father   . Heart disease Father   . Hypertension Sister   . Fibroids Sister   . Obesity Sister   . Breast cancer Maternal Aunt   . Obesity Sister     Past Medical History  Diagnosis Date  . Hypertension    Social History  Substance Use Topics  . Smoking status: Never Smoker   . Smokeless tobacco: Not on file  . Alcohol Use: 0.0 oz/week    0 Standard drinks or equivalent per week     Comment: OCCASIONALLY    Current outpatient prescriptions:  .  metoprolol succinate (TOPROL-XL) 50 MG 24 hr tablet, Take 1 tablet by mouth daily., Disp: , Rfl:   No Known Allergies  Review of Systems  Constitutional: Negative.   HENT: Negative.   Eyes: Negative.   Respiratory: Negative.   Cardiovascular: Negative.   Gastrointestinal: Positive for nausea.  Genitourinary: Negative.   Musculoskeletal: Negative.   Skin: Negative.   Neurological: Positive for dizziness.  Endo/Heme/Allergies: Negative.   Psychiatric/Behavioral: Negative.    Objective  Filed Vitals:   01/27/15 1056  BP: 142/84   Pulse: 64  Temp: 98.3 F (36.8 C)  TempSrc: Oral  Resp: 16  Weight: 200 lb 3.2 oz (90.81 kg)  SpO2: 98%   Physical Exam  Constitutional: She is oriented to person, place, and time and well-developed, well-nourished, and in no distress.  HENT:  Head: Normocephalic and atraumatic.  Eyes: Conjunctivae and EOM are normal.  Neck: Normal range of motion. Neck supple.  Cardiovascular: Normal rate and regular rhythm.   Pulmonary/Chest: Effort normal and breath sounds normal.  Abdominal: Soft. Bowel sounds are normal. There is tenderness.  Slight soreness in lower abdomen.  Genitourinary:  Flow heavy and unable to accomplish pelvic exam.  Musculoskeletal: Normal range of motion.  Neurological: She is alert and oriented to person, place, and time.  Psychiatric: Affect and judgment normal.   Assessment & Plan  1. Menorrhagia with irregular cycle Recent restart of menses 3 days ago after amenorrhea since Feb. 2016. Very heavy flow. Denies sexual activity. Will check HCG, CBC, TSH and CMP for metabolic disorder. Schedule pelvic and transvaginal ultrasound to rule out surgical pathology. Started Provera. Pending reports, may need referral to GYN for D&C. Advised to go to ER if dizziness or fainting occurs. Recheck pending reports. - CBC with Differential/Platelet - TSH - US Transvaginal Non-OB - medroxyPROGESTERone (PROVERA) 10 MG tablet; Take  1 tablet (10 mg total) by mouth daily.  Dispense: 10 tablet; Refill: 0 - hCG, serum, qualitative - US Pelvis Complete

## 2015-01-28 LAB — COMPREHENSIVE METABOLIC PANEL
ALBUMIN: 4 g/dL (ref 3.5–5.5)
ALK PHOS: 107 IU/L (ref 39–117)
ALT: 14 IU/L (ref 0–32)
AST: 20 IU/L (ref 0–40)
Albumin/Globulin Ratio: 1.1 (ref 1.1–2.5)
BUN / CREAT RATIO: 18 (ref 9–23)
BUN: 12 mg/dL (ref 6–24)
Bilirubin Total: 0.3 mg/dL (ref 0.0–1.2)
CO2: 23 mmol/L (ref 18–29)
CREATININE: 0.65 mg/dL (ref 0.57–1.00)
Calcium: 9.2 mg/dL (ref 8.7–10.2)
Chloride: 101 mmol/L (ref 97–106)
GFR calc non Af Amer: 110 mL/min/{1.73_m2} (ref >59)
GFR, EST AFRICAN AMERICAN: 127 mL/min/{1.73_m2} (ref >59)
GLUCOSE: 93 mg/dL (ref 65–99)
Globulin, Total: 3.5 g/dL (ref 1.5–4.5)
Potassium: 4.5 mmol/L (ref 3.5–5.2)
Sodium: 140 mmol/L (ref 136–144)
TOTAL PROTEIN: 7.5 g/dL (ref 6.0–8.5)

## 2015-01-28 LAB — CBC WITH DIFFERENTIAL/PLATELET
Basophils Absolute: 0 10*3/uL (ref 0.0–0.2)
Basos: 1 %
EOS (ABSOLUTE): 0.1 10*3/uL (ref 0.0–0.4)
Eos: 1 %
Hematocrit: 38.6 % (ref 34.0–46.6)
Hemoglobin: 12.3 g/dL (ref 11.1–15.9)
Immature Grans (Abs): 0 10*3/uL (ref 0.0–0.1)
Immature Granulocytes: 0 %
Lymphocytes Absolute: 2.3 10*3/uL (ref 0.7–3.1)
Lymphs: 36 %
MCH: 28.3 pg (ref 26.6–33.0)
MCHC: 31.9 g/dL (ref 31.5–35.7)
MCV: 89 fL (ref 79–97)
Monocytes Absolute: 0.4 10*3/uL (ref 0.1–0.9)
Monocytes: 6 %
Neutrophils Absolute: 3.6 10*3/uL (ref 1.4–7.0)
Neutrophils: 56 %
Platelets: 293 10*3/uL (ref 150–379)
RBC: 4.34 x10E6/uL (ref 3.77–5.28)
RDW: 14.4 % (ref 12.3–15.4)
WBC: 6.4 10*3/uL (ref 3.4–10.8)

## 2015-01-28 LAB — HCG, SERUM, QUALITATIVE: hCG,Beta Subunit,Qual,Serum: NEGATIVE m[IU]/mL (ref <6)

## 2015-01-28 LAB — TSH: TSH: 1.79 u[IU]/mL (ref 0.450–4.500)

## 2015-02-01 ENCOUNTER — Ambulatory Visit: Payer: 59

## 2015-02-23 ENCOUNTER — Ambulatory Visit: Payer: 59 | Admitting: Physician Assistant

## 2015-03-17 NOTE — Telephone Encounter (Signed)
done

## 2015-06-09 ENCOUNTER — Other Ambulatory Visit: Payer: Self-pay | Admitting: Physician Assistant

## 2015-06-09 DIAGNOSIS — I10 Essential (primary) hypertension: Secondary | ICD-10-CM

## 2015-06-09 MED ORDER — METOPROLOL SUCCINATE ER 50 MG PO TB24: 50.0000 mg | ORAL_TABLET | Freq: Every day | ORAL | Status: DC

## 2015-07-05 ENCOUNTER — Ambulatory Visit (INDEPENDENT_AMBULATORY_CARE_PROVIDER_SITE_OTHER): Payer: 59 | Admitting: Physician Assistant

## 2015-07-05 ENCOUNTER — Encounter: Payer: Self-pay | Admitting: Physician Assistant

## 2015-07-05 VITALS — BP 120/82 | HR 65 | Temp 98.0°F | Resp 16 | Wt 200.2 lb

## 2015-07-05 DIAGNOSIS — K2100 Gastro-esophageal reflux disease with esophagitis, without bleeding: Secondary | ICD-10-CM

## 2015-07-05 MED ORDER — PANTOPRAZOLE SODIUM 40 MG PO TBEC: 40.0000 mg | DELAYED_RELEASE_TABLET | Freq: Every day | ORAL | Status: DC

## 2015-07-05 NOTE — Patient Instructions (Signed)
Gastroesophageal Reflux Disease, Adult Normally, food travels down the esophagus and stays in the stomach to be digested. However, when a person has gastroesophageal reflux disease (GERD), food and stomach acid move back up into the esophagus. When this happens, the esophagus becomes sore and inflamed. Over time, GERD can create small holes (ulcers) in the lining of the esophagus.  CAUSES This condition is caused by a problem with the muscle between the esophagus and the stomach (lower esophageal sphincter, or LES). Normally, the LES muscle closes after food passes through the esophagus to the stomach. When the LES is weakened or abnormal, it does not close properly, and that allows food and stomach acid to go back up into the esophagus. The LES can be weakened by certain dietary substances, medicines, and medical conditions, including:  Tobacco use.  Pregnancy.  Having a hiatal hernia.  Heavy alcohol use.  Certain foods and beverages, such as coffee, chocolate, onions, and peppermint. RISK FACTORS This condition is more likely to develop in:  People who have an increased body weight.  People who have connective tissue disorders.  People who use NSAID medicines. SYMPTOMS Symptoms of this condition include:  Heartburn.  Difficult or painful swallowing.  The feeling of having a lump in the throat.  Abitter taste in the mouth.  Bad breath.  Having a large amount of saliva.  Having an upset or bloated stomach.  Belching.  Chest pain.  Shortness of breath or wheezing.  Ongoing (chronic) cough or a night-time cough.  Wearing away of tooth enamel.  Weight loss. Different conditions can cause chest pain. Make sure to see your health care provider if you experience chest pain. DIAGNOSIS Your health care provider will take a medical history and perform a physical exam. To determine if you have mild or severe GERD, your health care provider may also monitor how you respond  to treatment. You may also have other tests, including:  An endoscopy toexamine your stomach and esophagus with a small camera.  A test thatmeasures the acidity level in your esophagus.  A test thatmeasures how much pressure is on your esophagus.  A barium swallow or modified barium swallow to show the shape, size, and functioning of your esophagus. TREATMENT The goal of treatment is to help relieve your symptoms and to prevent complications. Treatment for this condition may vary depending on how severe your symptoms are. Your health care provider may recommend:  Changes to your diet.  Medicine.  Surgery. HOME CARE INSTRUCTIONS Diet  Follow a diet as recommended by your health care provider. This may involve avoiding foods and drinks such as:  Coffee and tea (with or without caffeine).  Drinks that containalcohol.  Energy drinks and sports drinks.  Carbonated drinks or sodas.  Chocolate and cocoa.  Peppermint and mint flavorings.  Garlic and onions.  Horseradish.  Spicy and acidic foods, including peppers, chili powder, curry powder, vinegar, hot sauces, and barbecue sauce.  Citrus fruit juices and citrus fruits, such as oranges, lemons, and limes.  Tomato-based foods, such as red sauce, chili, salsa, and pizza with red sauce.  Fried and fatty foods, such as donuts, french fries, potato chips, and high-fat dressings.  High-fat meats, such as hot dogs and fatty cuts of red and white meats, such as rib eye steak, sausage, ham, and bacon.  High-fat dairy items, such as whole milk, butter, and cream cheese.  Eat small, frequent meals instead of large meals.  Avoid drinking large amounts of liquid with your   meals.  Avoid eating meals during the 2-3 hours before bedtime.  Avoid lying down right after you eat.  Do not exercise right after you eat. General Instructions  Pay attention to any changes in your symptoms.  Take over-the-counter and prescription  medicines only as told by your health care provider. Do not take aspirin, ibuprofen, or other NSAIDs unless your health care provider told you to do so.  Do not use any tobacco products, including cigarettes, chewing tobacco, and e-cigarettes. If you need help quitting, ask your health care provider.  Wear loose-fitting clothing. Do not wear anything tight around your waist that causes pressure on your abdomen.  Raise (elevate) the head of your bed 6 inches (15cm).  Try to reduce your stress, such as with yoga or meditation. If you need help reducing stress, ask your health care provider.  If you are overweight, reduce your weight to an amount that is healthy for you. Ask your health care provider for guidance about a safe weight loss goal.  Keep all follow-up visits as told by your health care provider. This is important. SEEK MEDICAL CARE IF:  You have new symptoms.  You have unexplained weight loss.  You have difficulty swallowing, or it hurts to swallow.  You have wheezing or a persistent cough.  Your symptoms do not improve with treatment.  You have a hoarse voice. SEEK IMMEDIATE MEDICAL CARE IF:  You have pain in your arms, neck, jaw, teeth, or back.  You feel sweaty, dizzy, or light-headed.  You have chest pain or shortness of breath.  You vomit and your vomit looks like blood or coffee grounds.  You faint.  Your stool is bloody or black.  You cannot swallow, drink, or eat.   This information is not intended to replace advice given to you by your health care provider. Make sure you discuss any questions you have with your health care provider.   Document Released: 12/13/2004 Document Revised: 11/24/2014 Document Reviewed: 06/30/2014 Elsevier Interactive Patient Education 2016 Elsevier Inc.  Pantoprazole tablets What is this medicine? PANTOPRAZOLE (pan TOE pra zole) prevents the production of acid in the stomach. It is used to treat gastroesophageal reflux  disease (GERD), inflammation of the esophagus, and Zollinger-Ellison syndrome. This medicine may be used for other purposes; ask your health care provider or pharmacist if you have questions. What should I tell my health care provider before I take this medicine? They need to know if you have any of these conditions: -liver disease -low levels of magnesium in the blood -an unusual or allergic reaction to omeprazole, lansoprazole, pantoprazole, rabeprazole, other medicines, foods, dyes, or preservatives -pregnant or trying to get pregnant -breast-feeding How should I use this medicine? Take this medicine by mouth. Swallow the tablets whole with a drink of water. Follow the directions on the prescription label. Do not crush, break, or chew. Take your medicine at regular intervals. Do not take your medicine more often than directed. Talk to your pediatrician regarding the use of this medicine in children. While this drug may be prescribed for children as young as 5 years for selected conditions, precautions do apply. Overdosage: If you think you have taken too much of this medicine contact a poison control center or emergency room at once. NOTE: This medicine is only for you. Do not share this medicine with others. What if I miss a dose? If you miss a dose, take it as soon as you can. If it is almost time for your  next dose, take only that dose. Do not take double or extra doses. What may interact with this medicine? Do not take this medicine with any of the following medications: -atazanavir -nelfinavir This medicine may also interact with the following medications: -ampicillin -delavirdine -erlotinib -iron salts -medicines for fungal infections like ketoconazole, itraconazole and voriconazole -methotrexate -mycophenolate mofetil -warfarin This list may not describe all possible interactions. Give your health care provider a list of all the medicines, herbs, non-prescription drugs, or  dietary supplements you use. Also tell them if you smoke, drink alcohol, or use illegal drugs. Some items may interact with your medicine. What should I watch for while using this medicine? It can take several days before your stomach pain gets better. Check with your doctor or health care professional if your condition does not start to get better, or if it gets worse. You may need blood work done while you are taking this medicine. What side effects may I notice from receiving this medicine? Side effects that you should report to your doctor or health care professional as soon as possible: -allergic reactions like skin rash, itching or hives, swelling of the face, lips, or tongue -bone, muscle or joint pain -breathing problems -chest pain or chest tightness -dark yellow or brown urine -dizziness -fast, irregular heartbeat -feeling faint or lightheaded -fever or sore throat -muscle spasm -palpitations -redness, blistering, peeling or loosening of the skin, including inside the mouth -seizures -tremors -unusual bleeding or bruising -unusually weak or tired -yellowing of the eyes or skin Side effects that usually do not require medical attention (Report these to your doctor or health care professional if they continue or are bothersome.): -constipation -diarrhea -dry mouth -headache -nausea This list may not describe all possible side effects. Call your doctor for medical advice about side effects. You may report side effects to FDA at 1-800-FDA-1088. Where should I keep my medicine? Keep out of the reach of children. Store at room temperature between 15 and 30 degrees C (59 and 86 degrees F). Protect from light and moisture. Throw away any unused medicine after the expiration date. NOTE: This sheet is a summary. It may not cover all possible information. If you have questions about this medicine, talk to your doctor, pharmacist, or health care provider.    2016, Elsevier/Gold  Standard. (2014-04-23 14:45:56)

## 2015-07-05 NOTE — Progress Notes (Signed)
Patient: Anna [name] Female    DOB: 12-20-1970   45 y.o.   MRN: [id number] Visit Date: 07/05/2015  Today's Provider: Mar Daring, PA-C   Chief Complaint  Patient presents with  . Abdominal Pain   Subjective:    Abdominal Pain This is a new problem. The current episode started 1 to 4 weeks ago (2 weeks ago). The onset quality is sudden (as soon as she starts eating with any food). The problem occurs 2 to 4 times per day (with meals 2 to three times a day). Duration: half hour to full hour. The problem has been gradually worsening. The pain is located in the epigastric region. The pain is at a severity of 6/10. The pain is moderate. The quality of the pain is aching and a sensation of fullness. The abdominal pain does not radiate. Associated symptoms include belching. Pertinent negatives include no anorexia, arthralgias, constipation, diarrhea, dysuria, fever, flatus, frequency, headaches, hematochezia, hematuria, melena, nausea, vomiting or weight loss. The pain is aggravated by eating. Relieved by: Burping. She has tried nothing for the symptoms. Her past medical history is significant for GERD (was diagnosed many years ago with H.pylori-did antibiotic and PPI but discontinued after treatment time frame; no recent PPI or antacid).  She does state that recently she has been under an increased amount of stress over the last month or so.      No Known Allergies Previous Medications   MEDROXYPROGESTERONE (PROVERA) 10 MG TABLET    Take 1 tablet (10 mg total) by mouth daily.   METOPROLOL SUCCINATE (TOPROL-XL) 50 MG 24 HR TABLET    Take 1 tablet (50 mg total) by mouth daily.    Review of Systems  Constitutional: Negative for fever, chills and weight loss.  Respiratory: Negative for cough, chest tightness, shortness of breath and wheezing.   Cardiovascular: Negative for chest pain, palpitations and leg swelling.  Gastrointestinal: Positive for abdominal pain. Negative for nausea,  vomiting, diarrhea, constipation, melena, hematochezia, anorexia and flatus.  Genitourinary: Negative for dysuria, frequency and hematuria.  Musculoskeletal: Negative for arthralgias.  Neurological: Negative for headaches.  Psychiatric/Behavioral: Negative for dysphoric mood and decreased concentration. The patient is not nervous/anxious.     Social History  Substance Use Topics  . Smoking status: Never Smoker   . Smokeless tobacco: Not on file  . Alcohol Use: 0.0 oz/week    0 Standard drinks or equivalent per week     Comment: OCCASIONALLY   Objective:   BP 120/82 mmHg  Pulse 65  Temp(Src) 98 F (36.7 C) (Oral)  Resp 16  Wt 200 lb 3.2 oz (90.81 kg)  LMP 06/03/2015  Physical Exam  Constitutional: She is oriented to person, place, and time. She appears well-developed and well-nourished. No distress.  Cardiovascular: Normal rate, regular rhythm and normal heart sounds.  Exam reveals no gallop and no friction rub.   No murmur heard. Pulmonary/Chest: Effort normal and breath sounds normal. No respiratory distress. She has no wheezes. She has no rales.  Abdominal: Soft. Normal appearance and bowel sounds are normal. She exhibits no distension and no mass. There is no hepatosplenomegaly. There is tenderness (very mild) in the epigastric area. There is no rebound, no guarding and no CVA tenderness.  Neurological: She is alert and oriented to person, place, and time.  Skin: Skin is warm and dry. She is not diaphoretic.        Assessment & Plan:     1. Esophagitis,  reflux Will treat with protonix as below for GERD symptoms. She is to call the office if symptoms have not started improving in 2 weeks. If no improvement will test for H.Pylori and treat if positive. She is to call if symptoms worsen before then. If she does have symptom improvement she is to continue for 3 months and I will see her back then to re-evaluate. She will also be due for her CPE at that time as well.  -  pantoprazole (PROTONIX) 40 MG tablet; Take 1 tablet (40 mg total) by mouth daily.  Dispense: 90 tablet; Refill: 0       Mar Daring, PA-C  [facility]

## 2015-08-23 ENCOUNTER — Other Ambulatory Visit: Payer: Self-pay | Admitting: Physician Assistant

## 2015-08-23 DIAGNOSIS — K219 Gastro-esophageal reflux disease without esophagitis: Secondary | ICD-10-CM

## 2015-09-12 ENCOUNTER — Other Ambulatory Visit: Payer: Self-pay | Admitting: Physician Assistant

## 2015-09-12 DIAGNOSIS — I10 Essential (primary) hypertension: Secondary | ICD-10-CM

## 2015-09-12 MED ORDER — METOPROLOL SUCCINATE ER 50 MG PO TB24: 50.0000 mg | ORAL_TABLET | Freq: Every day | ORAL | Status: DC

## 2015-09-12 NOTE — Telephone Encounter (Signed)
Pt would like to get a week supply RX of metoprolol succinate (TOPROL-XL) 50 MG 24 hr tablet sent to [facility] to last her until she gets her mail order. Please advise. Thanks TNP

## 2015-12-01 ENCOUNTER — Telehealth: Payer: Self-pay | Admitting: Physician Assistant

## 2015-12-01 NOTE — Telephone Encounter (Signed)
Scheduled for 415 on Wednesday 12/07/15. Thanks TNP

## 2015-12-01 NOTE — Telephone Encounter (Signed)
Patient advised she can do 4:15 pm next Wednesday, can you please schedule her for me, patient already knows about the appointment-aa

## 2015-12-01 NOTE — Telephone Encounter (Signed)
Please review-aa 

## 2015-12-01 NOTE — Telephone Encounter (Signed)
Pt would like an appt to discuss with Tawanna Sat that she hasn't had a period since May and is still having abdominal pain that was discussed at her last OV. I didn't see any open appt slots for this week or next week. Can pt be worked into the schedule? Please advise. Thanks TNP

## 2015-12-01 NOTE — Telephone Encounter (Signed)
See if patient can do 1130 or 415 next weds?

## 2015-12-07 ENCOUNTER — Encounter: Payer: Self-pay | Admitting: Physician Assistant

## 2015-12-07 ENCOUNTER — Ambulatory Visit (INDEPENDENT_AMBULATORY_CARE_PROVIDER_SITE_OTHER): Payer: 59 | Admitting: Physician Assistant

## 2015-12-07 VITALS — BP 124/82 | HR 62 | Temp 97.4°F | Resp 14 | Wt 203.6 lb

## 2015-12-07 DIAGNOSIS — Z803 Family history of malignant neoplasm of breast: Secondary | ICD-10-CM

## 2015-12-07 DIAGNOSIS — N912 Amenorrhea, unspecified: Secondary | ICD-10-CM | POA: Diagnosis not present

## 2015-12-07 DIAGNOSIS — N926 Irregular menstruation, unspecified: Secondary | ICD-10-CM

## 2015-12-07 DIAGNOSIS — Z8041 Family history of malignant neoplasm of ovary: Secondary | ICD-10-CM

## 2015-12-07 NOTE — Progress Notes (Signed)
Patient: Anna [name] Female    DOB: 18-Jun-1970   45 y.o.   MRN: [id number] Visit Date: 12/12/2015  Today's Provider: Mar Daring, PA-C   Chief Complaint  Patient presents with  . Menstrual Problem   Subjective:    HPI Patient is here to follow up for abdominal pain and irregular menses. Patient reports the last menses she had was in April.  Patient reports abdominal pain is still present. Patient states she stopped taking the Protonix because it made her nauseated.  Normal PAP and exam on 09-24-14. She does have history of uterine fibroids.    Previous Medications   METOPROLOL SUCCINATE (TOPROL-XL) 50 MG 24 HR TABLET    Take 1 tablet (50 mg total) by mouth daily.    Review of Systems  Constitutional: Negative.   Respiratory: Negative.   Cardiovascular: Negative.   Gastrointestinal: Negative.   Genitourinary: Positive for menstrual problem and pelvic pain. Negative for dysuria, flank pain, frequency, vaginal bleeding, vaginal discharge and vaginal pain.       Amenorrhea     Social History  Substance Use Topics  . Smoking status: Never Smoker  . Smokeless tobacco: Not on file  . Alcohol use 0.0 oz/week     Comment: OCCASIONALLY   Objective:   BP 124/82 (BP Location: Right Arm, Patient Position: Sitting, Cuff Size: Large)   Pulse 62   Temp 97.4 F (36.3 C) (Oral)   Resp 14   Wt 203 lb 9.6 oz (92.4 kg)   BMI 37.24 kg/m   Physical Exam  Constitutional: She is oriented to person, place, and time. She appears well-developed and well-nourished. No distress.  Neck: Normal range of motion. Neck supple.  Cardiovascular: Normal rate, regular rhythm and normal heart sounds.  Exam reveals no gallop and no friction rub.   No murmur heard. Pulmonary/Chest: Effort normal and breath sounds normal. No respiratory distress. She has no wheezes. She has no rales.  Abdominal: Soft. Normal appearance and bowel sounds are normal. She exhibits no distension and no mass. There is no  hepatosplenomegaly. There is no tenderness. There is no rebound, no guarding and no CVA tenderness.  Neurological: She is alert and oriented to person, place, and time.  Skin: Skin is warm and dry. She is not diaphoretic.  Psychiatric: She has a normal mood and affect. Her behavior is normal. Judgment and thought content normal.  Vitals reviewed.     Assessment & Plan:     1. Abnormal menses We will check labs as below and I will follow-up pending results. Unknown reason for amenorrhea at this time. She does have family history of ovarian cancer and breast cancer so we will test for BRCA genes. We'll also check hormone levels to make sure she is not going through early menopause and will also check serum hCG to rule out any sort of pregnancy. Urine pregnancy test that she has done on her own have all been negative. She reports she has done 6 or 7. We did discuss trying oral contraceptives if necessary for trying to regulate the initial cycle if labs are normal. We also discussed obtaining pelvic and vaginal ultrasound to rule out cyst and fibroids if labs are stable and a referral to gynecology. She is to call the office if she has any acute issue in the meantime. - CBC with Differential  2. Family history of ovarian cancer See above medical treatment plan. - BRCA1 Targeted Analysis - BRCA2 Targeted Analysis  3. Absence  of menstruation See above medical treatment plan. - CBC with Differential - FSH - LH - Estrogens, total - hCG, serum, qualitative  4. Family history of breast cancer in first degree relative See above medical treatment plan. - BRCA1 Targeted Analysis - BRCA2 Targeted Analysis   Follow up: No Follow-up on file.

## 2016-01-04 ENCOUNTER — Telehealth: Payer: Self-pay | Admitting: Physician Assistant

## 2016-01-04 LAB — BRCA1 TARGETED ANALYSIS

## 2016-01-04 LAB — BRCA2 TARGETED ANALYSIS

## 2016-01-04 NOTE — Telephone Encounter (Signed)
Liz at [facility] needs clarifications of lab order for the BRCA. ° °Please call Liz back at [phone] ° °Thank sTeri  °

## 2016-01-04 NOTE — Telephone Encounter (Signed)
Natalie with [facility] called back regarding the test and genetic testing.  Her call back [phone]

## 2016-01-05 LAB — SPECIMEN STATUS REPORT

## 2016-01-05 NOTE — Telephone Encounter (Signed)
Tried calling Natalie back from [employer] but number provider doesn't go thru.  Thanks,  -Samanthajo Payano

## 2016-01-05 NOTE — Telephone Encounter (Signed)
Spoke with Liz from [facility] yesterday @ 4:00 pm regarding the BRCA testing and she stated that for a normal comprenhensive test the test number was [id number]. She will fax over the verbal request form.  Thanks,  -Joseline 

## 2016-01-09 ENCOUNTER — Telehealth: Payer: Self-pay

## 2016-01-09 NOTE — Telephone Encounter (Signed)
-----  Message from Mar Daring, [place] sent at 01/06/2016 11:13 AM EDT ----- Hormones show you may possibly be in early stages of menopause (perimenopausal), because estrogen levels are low but not post menopausal readings. Menstrual cycle may be very irregular during this time. We mostly judge you becoming postmenopausal by you not having a menstrual cycle x 1 calendar year. Start calculating time from last menstrual and we will monitor. BRCA testing is not resulted yet. Will notify once I receive these results.

## 2016-01-09 NOTE — Telephone Encounter (Signed)
LMTCB

## 2016-01-09 NOTE — Telephone Encounter (Signed)
Patient advised as below. Patient verbalizes understanding and is in agreement with treatment plan.  

## 2016-01-10 ENCOUNTER — Telehealth: Payer: Self-pay | Admitting: Physician Assistant

## 2016-01-10 NOTE — Telephone Encounter (Signed)
Nathalie with [facility] called to verify if we have rec'd the [employer] forms.  [id number]

## 2016-01-10 NOTE — Telephone Encounter (Signed)
Per [employer] the [employer] is requesting a Dietitian and he reports that they had faxed the form twice but when I verify the fax number it was the wrong one. Gave correct fax number for the form to be refax.   Thanks,  -Joseline

## 2016-01-11 ENCOUNTER — Encounter: Payer: Self-pay | Admitting: Physician Assistant

## 2016-01-11 NOTE — Telephone Encounter (Signed)
Form completed and will be faxed.

## 2016-01-17 ENCOUNTER — Telehealth: Payer: Self-pay | Admitting: Physician Assistant

## 2016-01-17 LAB — CBC WITH DIFFERENTIAL/PLATELET
BASOS ABS: 0 10*3/uL (ref 0.0–0.2)
Basos: 1 %
EOS (ABSOLUTE): 0.1 10*3/uL (ref 0.0–0.4)
Eos: 2 %
Hematocrit: 35.2 % (ref 34.0–46.6)
Hemoglobin: 11.6 g/dL (ref 11.1–15.9)
Immature Grans (Abs): 0 10*3/uL (ref 0.0–0.1)
Immature Granulocytes: 0 %
LYMPHS ABS: 2.8 10*3/uL (ref 0.7–3.1)
Lymphs: 45 %
MCH: 27 pg (ref 26.6–33.0)
MCHC: 33 g/dL (ref 31.5–35.7)
MCV: 82 fL (ref 79–97)
MONOS ABS: 0.5 10*3/uL (ref 0.1–0.9)
Monocytes: 8 %
Neutrophils Absolute: 2.7 10*3/uL (ref 1.4–7.0)
Neutrophils: 44 %
Platelets: 319 10*3/uL (ref 150–379)
RBC: 4.29 x10E6/uL (ref 3.77–5.28)
RDW: 16.3 % — AB (ref 12.3–15.4)
WBC: 6.1 10*3/uL (ref 3.4–10.8)

## 2016-01-17 LAB — HCG, SERUM, QUALITATIVE: hCG,Beta Subunit,Qual,Serum: NEGATIVE m[IU]/mL (ref <6)

## 2016-01-17 LAB — ESTROGENS, TOTAL: Estrogen: 100 pg/mL

## 2016-01-17 LAB — LUTEINIZING HORMONE: LH: 19.8 m[IU]/mL

## 2016-01-17 LAB — BRCASSURE COMPREHENSIVE TEST

## 2016-01-17 LAB — FOLLICLE STIMULATING HORMONE: FSH: 36.3 m[IU]/mL

## 2016-01-17 NOTE — Telephone Encounter (Signed)
Pt states she faxed a form to be completed for a health screening last week.  Pt states she need to pick this completed for up tomorrow if possible.  [id number]

## 2016-01-17 NOTE — Telephone Encounter (Signed)
Patient advised as below.  

## 2016-01-17 NOTE — Telephone Encounter (Signed)
Form was just completed today due to me being out of the office. Patient may pick up today.

## 2016-05-16 ENCOUNTER — Other Ambulatory Visit: Payer: Self-pay | Admitting: Physician Assistant

## 2016-05-16 DIAGNOSIS — I10 Essential (primary) hypertension: Secondary | ICD-10-CM

## 2016-12-19 ENCOUNTER — Other Ambulatory Visit: Payer: Self-pay | Admitting: Physician Assistant

## 2016-12-19 ENCOUNTER — Telehealth: Payer: Self-pay

## 2016-12-19 DIAGNOSIS — I10 Essential (primary) hypertension: Secondary | ICD-10-CM

## 2016-12-19 MED ORDER — METOPROLOL SUCCINATE ER 50 MG PO TB24: 50.0000 mg | ORAL_TABLET | Freq: Every day | ORAL | 0 refills | Status: DC

## 2016-12-19 NOTE — Telephone Encounter (Signed)
Sent one week to [facility]

## 2016-12-19 NOTE — Telephone Encounter (Signed)
Patient advised as directed below.  Thanks,  -Joseline 

## 2016-12-19 NOTE — Telephone Encounter (Signed)
Pt states she ran out of medication today. Her metoprolol was sent to mail order pharmacy today, but she is asking if you could send a week's supply to her local pharmacy to last her until the medication is delivered. [facility].

## 2016-12-19 NOTE — Telephone Encounter (Signed)
Please Review.  Thanks,  -Joseline 

## 2017-01-30 ENCOUNTER — Encounter: Payer: Self-pay | Admitting: Physician Assistant

## 2017-01-30 ENCOUNTER — Ambulatory Visit: Payer: 59 | Admitting: Physician Assistant

## 2017-01-30 ENCOUNTER — Other Ambulatory Visit: Payer: Self-pay

## 2017-01-30 VITALS — BP 134/94 | HR 78 | Temp 98.6°F | Resp 16 | Wt 207.0 lb

## 2017-01-30 DIAGNOSIS — J069 Acute upper respiratory infection, unspecified: Secondary | ICD-10-CM

## 2017-01-30 NOTE — Patient Instructions (Signed)
Chloraseptic spray and salt water gargles for sore throat Mucinex or Mucinex DM for congestion and cough Coricidin HBP if Mucinex not wanted Ibuprofen or tylenol as needed for low grade fever and body aches Call if you develop fever greater than 101, start to feel better then worsen, or if symptoms last more than 10 days

## 2017-01-30 NOTE — Progress Notes (Signed)
Patient: Anna [name] Female    DOB: [birth year]   46 y.o.   MRN: [id number] Visit Date: 01/30/2017  Today's Provider: Mar Daring, PA-C   Chief Complaint  Patient presents with  . URI   Subjective:    HPI Upper Respiratory Infection: Patient complains of symptoms of a URI, possible sinusitis. Symptoms include sore throat. Onset of symptoms was 2 days ago, gradually worsening since that time. She also c/o bilateral ear pressure/pain, facial pain and sinus pressure for the past 1 day .  She is drinking plenty of fluids. Evaluation to date: none. Treatment to date: none.     No Known Allergies   Current Outpatient Medications:  .  metoprolol succinate (TOPROL-XL) 50 MG 24 hr tablet, Take 1 tablet (50 mg total) by mouth daily. Take with or immediately following a meal., Disp: 7 tablet, Rfl: 0  Review of Systems  Constitutional: Negative.   HENT: Positive for ear pain, sinus pressure, sinus pain and sore throat.   Respiratory: Positive for cough.   Cardiovascular: Negative.   Gastrointestinal: Negative for abdominal pain and nausea.  Neurological: Positive for headaches. Negative for dizziness and light-headedness.    Social History   Tobacco Use  . Smoking status: Never Smoker  . Smokeless tobacco: Never Used  Substance Use Topics  . Alcohol use: Yes    Alcohol/week: 0.0 oz    Comment: OCCASIONALLY   Objective:   BP (!) 134/94 (BP Location: Left Arm, Patient Position: Sitting, Cuff Size: Large)   Pulse 78   Temp 98.6 F (37 C) (Oral)   Resp 16   Wt 207 lb (93.9 kg)   SpO2 99%   BMI 37.86 kg/m  Vitals:   01/30/17 0905  BP: (!) 134/94  Pulse: 78  Resp: 16  Temp: 98.6 F (37 C)  TempSrc: Oral  SpO2: 99%  Weight: 207 lb (93.9 kg)     Physical Exam  Constitutional: She appears well-developed and well-nourished. No distress.  HENT:  Head: Normocephalic and atraumatic.  Right Ear: Hearing, tympanic membrane, external ear and ear canal normal.   Left Ear: Hearing, tympanic membrane, external ear and ear canal normal.  Nose: Nose normal.  Mouth/Throat: Uvula is midline, oropharynx is clear and moist and mucous membranes are normal. No oropharyngeal exudate.  Eyes: Conjunctivae are normal. Pupils are equal, round, and reactive to light. Right eye exhibits no discharge. Left eye exhibits no discharge. No scleral icterus.  Neck: Normal range of motion. Neck supple. No tracheal deviation present. No thyromegaly present.  Cardiovascular: Normal rate, regular rhythm and normal heart sounds. Exam reveals no gallop and no friction rub.  No murmur heard. Pulmonary/Chest: Effort normal and breath sounds normal. No stridor. No respiratory distress. She has no wheezes. She has no rales.  Lymphadenopathy:    She has no cervical adenopathy.  Skin: Skin is warm and dry. She is not diaphoretic.  Vitals reviewed.       Assessment & Plan:     1. Viral upper respiratory tract infection Suspect viral URI. Symptomatic treatments discussed as below: Chloraseptic spray and salt water gargles for sore throat Mucinex or Mucinex DM for congestion and cough Coricidin HBP if Mucinex not wanted Ibuprofen or tylenol as needed for low grade fever and body aches Call if you develop fever greater than 101, start to feel better then worsen, or if symptoms last more than 10 days       Mar Daring, PA-C  [facility]

## 2017-02-26 ENCOUNTER — Telehealth: Payer: Self-pay | Admitting: Physician Assistant

## 2017-02-26 DIAGNOSIS — I10 Essential (primary) hypertension: Secondary | ICD-10-CM

## 2017-02-26 MED ORDER — METOPROLOL SUCCINATE ER 50 MG PO TB24: 50.0000 mg | ORAL_TABLET | Freq: Every day | ORAL | 0 refills | Status: DC

## 2017-02-26 NOTE — Telephone Encounter (Signed)
Since 06/30/14 we have only had her documented taking Metoprolol ER 50mg  once daily. This is all that has ever been filled by me since that date as well. I have no documentation of ever increasing it.  She needs an appt for her BP and CPE anyway since she has not had a CPE since 09/24/2014. I will refill now and we can discuss at that [place].

## 2017-02-26 NOTE — Telephone Encounter (Signed)
Please Review.  Thanks,  -Joseline 

## 2017-02-26 NOTE — Telephone Encounter (Signed)
Pt requesting a refill of Metoprolol 50MG  [facility] in [place].  Pt states she will be out in a couple of days.    Pt also wants to know why she is at 50 MG states she use to be on 100 MG and was taking tow 50 MG at one time and that is why she ran out.  She is wanting to know the correct dosage she should be taking.

## 2017-02-26 NOTE — Telephone Encounter (Signed)
No answer , no voicemail came up only rings and rings.  Thanks,  -Khiya Friese

## 2017-03-07 NOTE — Telephone Encounter (Signed)
Advised and appointment for CPE made

## 2017-03-07 NOTE — Telephone Encounter (Signed)
Pt returned call and request call back. Thanks TNP

## 2017-03-07 NOTE — Telephone Encounter (Signed)
Called the patient back and there was no answer.  ED

## 2017-03-07 NOTE — Telephone Encounter (Signed)
LMT[facility] ED 

## 2017-03-26 ENCOUNTER — Other Ambulatory Visit: Payer: Self-pay | Admitting: Physician Assistant

## 2017-03-26 DIAGNOSIS — I10 Essential (primary) hypertension: Secondary | ICD-10-CM

## 2017-03-29 ENCOUNTER — Encounter: Payer: Self-pay | Admitting: Physician Assistant

## 2017-04-10 ENCOUNTER — Ambulatory Visit (INDEPENDENT_AMBULATORY_CARE_PROVIDER_SITE_OTHER): Payer: Managed Care, Other (non HMO) | Admitting: Physician Assistant

## 2017-04-10 ENCOUNTER — Encounter: Payer: Self-pay | Admitting: Physician Assistant

## 2017-04-10 VITALS — BP 126/84 | HR 80 | Temp 98.6°F | Ht 62.0 in | Wt 212.4 lb

## 2017-04-10 DIAGNOSIS — I10 Essential (primary) hypertension: Secondary | ICD-10-CM

## 2017-04-10 NOTE — Progress Notes (Signed)
Patient: Anna [name], Female    DOB: [birth year], 47 y.o.   MRN: [id number] Visit Date: 04/10/2017  Today's Provider: Mar Daring, PA-C   Chief Complaint  Patient presents with  . Annual Exam   Subjective:    Hypertension, follow-up:  BP Readings from Last 3 Encounters:  04/10/17 126/84  01/30/17 (!) 134/94  12/07/15 124/82    She was last seen for hypertension 2 months ago.  BP at that visit was 134/94. Management changes since that visit include no changes. She reports excellent compliance with treatment. She is not having side effects.  She is not exercising. She is not adherent to low salt diet.   Outside blood pressures are not being checked. She is experiencing none.  Patient denies chest pain.   Cardiovascular risk factors include obesity (BMI >= 30 kg/m2).  Use of agents associated with hypertension: none.     Weight trend: increasing rapidly Wt Readings from Last 3 Encounters:  04/10/17 212 lb 6.4 oz (96.3 kg)  01/30/17 207 lb (93.9 kg)  12/07/15 203 lb 9.6 oz (92.4 kg)    Current diet: in general, a "healthy" diet    ------------------------------------------------------------------------  Review of Systems  Constitutional: Negative.   HENT: Negative.   Eyes: Negative.   Respiratory: Negative.   Cardiovascular: Negative.   Gastrointestinal: Negative.   Endocrine: Negative.   Genitourinary: Negative.   Musculoskeletal: Negative.   Skin: Negative.   Allergic/Immunologic: Negative.   Neurological: Negative.   Hematological: Negative.   Psychiatric/Behavioral: Negative.     Social History      She  reports that  has never smoked. she has never used smokeless tobacco. She reports that she drinks alcohol. She reports that she does not use drugs.       Social History   Socioeconomic History  . Marital status: Single    Spouse name: None  . Number of children: None  . Years of education: None  . Highest education level: None    Social Needs  . Financial resource strain: None  . Food insecurity - worry: None  . Food insecurity - inability: None  . Transportation needs - medical: None  . Transportation needs - non-medical: None  Occupational History    Employer: [employer]  Tobacco Use  . Smoking status: Never Smoker  . Smokeless tobacco: Never Used  Substance and Sexual Activity  . Alcohol use: Yes    Alcohol/week: 0.0 oz    Comment: OCCASIONALLY  . Drug use: No  . Sexual activity: None  Other Topics Concern  . None  Social History Narrative  . None    Past Medical History:  Diagnosis Date  . Hypertension      Patient Active Problem List   Diagnosis Date Noted  . Family history of breast cancer in first degree relative 12/07/2015  . Abnormal menses 01/12/2015  . Family history of ovarian cancer 01/12/2015  . BMI 35.0-35.9,adult 09/24/2014  . Adult-onset obesity 09/14/2014  . Absence of menstruation 09/14/2014  . Anxiety 09/14/2014  . Clinical depression 09/14/2014  . Elevated blood sugar 09/14/2014  . Esophagitis, reflux 09/14/2014  . Family history of colon cancer 09/14/2014  . Helicobacter pylori infection 09/14/2014  . Cephalalgia 09/14/2014  . Cold sore 09/14/2014  . Large breasts 09/14/2014  . Night sweat 09/14/2014  . Awareness of heartbeats 09/14/2014  . Borderline diabetes 09/14/2014  . Avitaminosis D 07/01/2008  . Hypercholesterolemia without hypertriglyceridemia 04/21/2003  . Benign essential HTN 04/16/2003  Past Surgical History:  Procedure Laterality Date  . NO PAST SURGERIES      Family History        Family Status  Relation Name Status  . Mother  Alive  . Father  Alive  . Sister 1 Alive  . [employer]  . MGM  Deceased  . MGF  Deceased  . PGM  Deceased  . PGF  Deceased  . Sister 2 Alive        Her family history includes Breast cancer in her maternal aunt and mother; COPD in her father; Colon cancer in her father; Fibroids in her sister; Heart attack  in her mother; Heart disease in her father; Hypertension in her father, mother, and sister; Obesity in her sister and sister; Stroke in her mother.     No Known Allergies   Current Outpatient Medications:  .  metoprolol succinate (TOPROL-XL) 50 MG 24 hr tablet, TAKE 1 TABLET (50 MG TOTAL) BY MOUTH DAILY. TAKE WITH OR IMMEDIATELY FOLLOWING A MEAL., Disp: 90 tablet, Rfl: 1   Patient Care Team: Mar Daring, PA-C as PCP - General (Physician Assistant)      Objective:   Vitals: BP 126/84 (BP Location: Left Arm, Patient Position: Sitting, Cuff Size: Large)   Pulse 80   Temp 98.6 F (37 C) (Oral)   Ht 5\' 2"  (1.575 m)   Wt 212 lb 6.4 oz (96.3 kg)   LMP 04/08/2017 (Exact Date)   SpO2 98%   BMI 38.85 kg/m     Physical Exam  Constitutional: She appears well-developed and well-nourished. No distress.  Neck: Normal range of motion. Neck supple.  Cardiovascular: Normal rate, regular rhythm and normal heart sounds. Exam reveals no gallop and no friction rub.  No murmur heard. Pulmonary/Chest: Effort normal and breath sounds normal. No respiratory distress. She has no wheezes. She has no rales.  Musculoskeletal: She exhibits no edema.  Skin: She is not diaphoretic.  Vitals reviewed.    Depression Screen No flowsheet data found.    Assessment & Plan:     Routine Health Maintenance and Physical Exam  Exercise Activities and Dietary recommendations Goals    None      Immunization History  Administered Date(s) Administered  . Influenza,inj,Quad PF,6+ Mos 01/12/2014  . Tdap 06/25/2006, 06/29/2008    Health Maintenance  Topic Date Due  . HIV Screening  07/08/1987  . MAMMOGRAM  08/06/2014  . INFLUENZA VACCINE  10/17/2016  . PAP SMEAR  09/23/2017  . TETANUS/TDAP  06/30/2018     Discussed health benefits of physical activity, and encouraged her to engage in regular exercise appropriate for her age and condition.    1. Essential hypertension Stable on  Metoprolol 50mg  XR. Continue current dose.  I will see her back in 2-3 weeks for her CPE. Unable to due CPE today due to her being on her menstrual cycle.   --------------------------------------------------------------------    Mar Daring, PA-C  [place]

## 2017-04-10 NOTE — Patient Instructions (Signed)
DASH Eating Plan DASH stands for "Dietary Approaches to Stop Hypertension." The DASH eating plan is a healthy eating plan that has been shown to reduce high blood pressure (hypertension). It may also reduce your risk for type 2 diabetes, heart disease, and stroke. The DASH eating plan may also help with weight loss. What are tips for following this plan? General guidelines  Avoid eating more than 2,300 mg (milligrams) of salt (sodium) a day. If you have hypertension, you may need to reduce your sodium intake to 1,500 mg a day.  Limit alcohol intake to no more than 1 drink a day for nonpregnant women and 2 drinks a day for men. One drink equals 12 oz of beer, 5 oz of wine, or 1 oz of hard liquor.  Work with your health care provider to maintain a healthy body weight or to lose weight. Ask what an ideal weight is for you.  Get at least 30 minutes of exercise that causes your heart to beat faster (aerobic exercise) most days of the week. Activities may include walking, swimming, or biking.  Work with your health care provider or diet and nutrition specialist (dietitian) to adjust your eating plan to your individual calorie needs. Reading food labels  Check food labels for the amount of sodium per serving. Choose foods with less than 5 percent of the Daily Value of sodium. Generally, foods with less than 300 mg of sodium per serving fit into this eating plan.  To find whole grains, look for the word "whole" as the first word in the ingredient list. Shopping  Buy products labeled as "low-sodium" or "no salt added."  Buy fresh foods. Avoid canned foods and premade or frozen meals. Cooking  Avoid adding salt when cooking. Use salt-free seasonings or herbs instead of table salt or sea salt. Check with your health care provider or pharmacist before using salt substitutes.  Do not fry foods. Cook foods using healthy methods such as baking, boiling, grilling, and broiling instead.  Cook with  heart-healthy oils, such as olive, canola, soybean, or sunflower oil. Meal planning   Eat a balanced diet that includes: ? 5 or more servings of fruits and vegetables each day. At each meal, try to fill half of your plate with fruits and vegetables. ? Up to 6-8 servings of whole grains each day. ? Less than 6 oz of lean meat, poultry, or fish each day. A 3-oz serving of meat is about the same size as a deck of cards. One egg equals 1 oz. ? 2 servings of low-fat dairy each day. ? A serving of nuts, seeds, or beans 5 times each week. ? Heart-healthy fats. Healthy fats called Omega-3 fatty acids are found in foods such as flaxseeds and coldwater fish, like sardines, salmon, and mackerel.  Limit how much you eat of the following: ? Canned or prepackaged foods. ? Food that is high in trans fat, such as fried foods. ? Food that is high in saturated fat, such as fatty meat. ? Sweets, desserts, sugary drinks, and other foods with added sugar. ? Full-fat dairy products.  Do not salt foods before eating.  Try to eat at least 2 vegetarian meals each week.  Eat more home-cooked food and less restaurant, buffet, and fast food.  When eating at a restaurant, ask that your food be prepared with less salt or no salt, if possible. What foods are recommended? The items listed may not be a complete list. Talk with your dietitian about what   dietary choices are best for you. Grains Whole-grain or whole-wheat bread. Whole-grain or whole-wheat pasta. Brown rice. Oatmeal. Quinoa. Bulgur. Whole-grain and low-sodium cereals. Pita bread. Low-fat, low-sodium crackers. Whole-wheat flour tortillas. Vegetables Fresh or frozen vegetables (raw, steamed, roasted, or grilled). Low-sodium or reduced-sodium tomato and vegetable juice. Low-sodium or reduced-sodium tomato sauce and tomato paste. Low-sodium or reduced-sodium canned vegetables. Fruits All fresh, dried, or frozen fruit. Canned fruit in natural juice (without  added sugar). Meat and other protein foods Skinless chicken or turkey. Ground chicken or turkey. Pork with fat trimmed off. Fish and seafood. Egg whites. Dried beans, peas, or lentils. Unsalted nuts, nut butters, and seeds. Unsalted canned beans. Lean cuts of beef with fat trimmed off. Low-sodium, lean deli meat. Dairy Low-fat (1%) or fat-free (skim) milk. Fat-free, low-fat, or reduced-fat cheeses. Nonfat, low-sodium ricotta or cottage cheese. Low-fat or nonfat yogurt. Low-fat, low-sodium cheese. Fats and oils Soft margarine without trans fats. Vegetable oil. Low-fat, reduced-fat, or light mayonnaise and salad dressings (reduced-sodium). Canola, safflower, olive, soybean, and sunflower oils. Avocado. Seasoning and other foods Herbs. Spices. Seasoning mixes without salt. Unsalted popcorn and pretzels. Fat-free sweets. What foods are not recommended? The items listed may not be a complete list. Talk with your dietitian about what dietary choices are best for you. Grains Baked goods made with fat, such as croissants, muffins, or some breads. Dry pasta or rice meal packs. Vegetables Creamed or fried vegetables. Vegetables in a cheese sauce. Regular canned vegetables (not low-sodium or reduced-sodium). Regular canned tomato sauce and paste (not low-sodium or reduced-sodium). Regular tomato and vegetable juice (not low-sodium or reduced-sodium). Pickles. Olives. Fruits Canned fruit in a light or heavy syrup. Fried fruit. Fruit in cream or butter sauce. Meat and other protein foods Fatty cuts of meat. Ribs. Fried meat. Bacon. Sausage. Bologna and other processed lunch meats. Salami. Fatback. Hotdogs. Bratwurst. Salted nuts and seeds. Canned beans with added salt. Canned or smoked fish. Whole eggs or egg yolks. Chicken or turkey with skin. Dairy Whole or 2% milk, cream, and half-and-half. Whole or full-fat cream cheese. Whole-fat or sweetened yogurt. Full-fat cheese. Nondairy creamers. Whipped toppings.  Processed cheese and cheese spreads. Fats and oils Butter. Stick margarine. Lard. Shortening. Ghee. Bacon fat. Tropical oils, such as coconut, palm kernel, or palm oil. Seasoning and other foods Salted popcorn and pretzels. Onion salt, garlic salt, seasoned salt, table salt, and sea salt. Worcestershire sauce. Tartar sauce. Barbecue sauce. Teriyaki sauce. Soy sauce, including reduced-sodium. Steak sauce. Canned and packaged gravies. Fish sauce. Oyster sauce. Cocktail sauce. Horseradish that you find on the shelf. Ketchup. Mustard. Meat flavorings and tenderizers. Bouillon cubes. Hot sauce and Tabasco sauce. Premade or packaged marinades. Premade or packaged taco seasonings. Relishes. Regular salad dressings. Where to find more information:  National Heart, Lung, and Blood Institute: www.nhlbi.nih.gov  American Heart Association: www.heart.org Summary  The DASH eating plan is a healthy eating plan that has been shown to reduce high blood pressure (hypertension). It may also reduce your risk for type 2 diabetes, heart disease, and stroke.  With the DASH eating plan, you should limit salt (sodium) intake to 2,300 mg a day. If you have hypertension, you may need to reduce your sodium intake to 1,500 mg a day.  When on the DASH eating plan, aim to eat more fresh fruits and vegetables, whole grains, lean proteins, low-fat dairy, and heart-healthy fats.  Work with your health care provider or diet and nutrition specialist (dietitian) to adjust your eating plan to your individual   calorie needs. This information is not intended to replace advice given to you by your health care provider. Make sure you discuss any questions you have with your health care provider. Document Released: 02/22/2011 Document Revised: 02/27/2016 Document Reviewed: 02/27/2016 Elsevier Interactive Patient Education  2018 Elsevier Inc.  

## 2017-04-30 ENCOUNTER — Encounter: Payer: Self-pay | Admitting: Physician Assistant

## 2017-05-22 ENCOUNTER — Encounter: Payer: Self-pay | Admitting: Physician Assistant

## 2017-05-22 NOTE — Progress Notes (Deleted)
       Patient: Anna [name] Female    DOB: March 03, 1971   47 y.o.   MRN: [id number] Visit Date: 05/22/2017  Today's Provider: Mar Daring, PA-C   No chief complaint on file.  Subjective:    HPI     No Known Allergies   Current Outpatient Medications:  .  metoprolol succinate (TOPROL-XL) 50 MG 24 hr tablet, TAKE 1 TABLET (50 MG TOTAL) BY MOUTH DAILY. TAKE WITH OR IMMEDIATELY FOLLOWING A MEAL., Disp: 90 tablet, Rfl: 1  Review of Systems  Social History   Tobacco Use  . Smoking status: Never Smoker  . Smokeless tobacco: Never Used  Substance Use Topics  . Alcohol use: Yes    Alcohol/week: 0.0 oz    Comment: OCCASIONALLY   Objective:   There were no vitals taken for this visit.   Physical Exam      Assessment & Plan:           Mar Daring, PA-C  [facility]

## 2017-05-22 NOTE — Progress Notes (Deleted)
Patient: Anna [name], Female    DOB: 06-19-1970, 47 y.o.   MRN: [id number] Visit Date: 05/22/2017  Today's Provider: Mar Daring, PA-C   No chief complaint on file.  Subjective:    Annual physical exam Anna [name] is a 47 y.o. female who presents today for health maintenance and complete physical. She feels {DESC; WELL/FAIRLY WELL/POORLY:18703}. She reports exercising ***. She reports she is sleeping {DESC; WELL/FAIRLY WELL/POORLY:18703}.  Pap-09/24/14 Tdap-06/29/08 -----------------------------------------------------------------   Review of Systems  Social History      She  reports that  has never smoked. she has never used smokeless tobacco. She reports that she drinks alcohol. She reports that she does not use drugs.       Social History   Socioeconomic History  . Marital status: Single    Spouse name: Not on file  . Number of children: Not on file  . Years of education: Not on file  . Highest education level: Not on file  Social Needs  . Financial resource strain: Not on file  . Food insecurity - worry: Not on file  . Food insecurity - inability: Not on file  . Transportation needs - medical: Not on file  . Transportation needs - non-medical: Not on file  Occupational History    Employer: [employer]  Tobacco Use  . Smoking status: Never Smoker  . Smokeless tobacco: Never Used  Substance and Sexual Activity  . Alcohol use: Yes    Alcohol/week: 0.0 oz    Comment: OCCASIONALLY  . Drug use: No  . Sexual activity: Not on file  Other Topics Concern  . Not on file  Social History Narrative  . Not on file    Past Medical History:  Diagnosis Date  . Hypertension      Patient Active Problem List   Diagnosis Date Noted  . Family history of breast cancer in first degree relative 12/07/2015  . Abnormal menses 01/12/2015  . Family history of ovarian cancer 01/12/2015  . BMI 35.0-35.9,adult 09/24/2014  . Adult-onset obesity 09/14/2014  .  Absence of menstruation 09/14/2014  . Anxiety 09/14/2014  . Clinical depression 09/14/2014  . Elevated blood sugar 09/14/2014  . Esophagitis, reflux 09/14/2014  . Family history of colon cancer 09/14/2014  . Helicobacter pylori infection 09/14/2014  . Cephalalgia 09/14/2014  . Cold sore 09/14/2014  . Large breasts 09/14/2014  . Night sweat 09/14/2014  . Awareness of heartbeats 09/14/2014  . Borderline diabetes 09/14/2014  . Avitaminosis D 07/01/2008  . Hypercholesterolemia without hypertriglyceridemia 04/21/2003  . Benign essential HTN 04/16/2003    Past Surgical History:  Procedure Laterality Date  . NO PAST SURGERIES      Family History        Family Status  Relation Name Status  . Mother  Alive  . Father  Alive  . Sister 1 Alive  . [employer]  . MGM  Deceased  . MGF  Deceased  . PGM  Deceased  . PGF  Deceased  . Sister 2 Alive        Her family history includes Breast cancer in her maternal aunt and mother; COPD in her father; Colon cancer in her father; Fibroids in her sister; Heart attack in her mother; Heart disease in her father; Hypertension in her father, mother, and sister; Obesity in her sister and sister; Stroke in her mother.      No Known Allergies   Current Outpatient Medications:  .  metoprolol succinate (TOPROL-XL) 50 MG 24 hr tablet, TAKE 1 TABLET (50 MG TOTAL) BY MOUTH DAILY. TAKE WITH OR IMMEDIATELY FOLLOWING A MEAL., Disp: 90 tablet, Rfl: 1   Patient Care Team: Rubye Beach as PCP - General (Physician Assistant)      Objective:   Vitals: There were no vitals taken for this visit.   Physical Exam  Constitutional: She is oriented to person, place, and time. She appears well-developed and well-nourished.  HENT:  Head: Normocephalic and atraumatic.  Right Ear: External ear normal.  Left Ear: External ear normal.  Nose: Nose normal.  Mouth/Throat: Oropharynx is clear and moist.  Eyes: Conjunctivae and EOM are normal.  Pupils are equal, round, and reactive to light.  Neck: Normal range of motion. Neck supple.  Cardiovascular: Normal rate, regular rhythm, normal heart sounds and intact distal pulses.  Pulmonary/Chest: Effort normal and breath sounds normal.  Abdominal: Soft. Bowel sounds are normal.  Musculoskeletal: Normal range of motion.  Neurological: She is alert and oriented to person, place, and time. She has normal reflexes.  Skin: Skin is warm and dry.  Psychiatric: She has a normal mood and affect. Her behavior is normal. Judgment and thought content normal.     Depression Screen No flowsheet data found.    Assessment & Plan:     Routine Health Maintenance and Physical Exam  Exercise Activities and Dietary recommendations Goals    None      Immunization History  Administered Date(s) Administered  . Influenza,inj,Quad PF,6+ Mos 01/12/2014  . Tdap 06/25/2006, 06/29/2008    Health Maintenance  Topic Date Due  . HIV Screening  07/08/1987  . MAMMOGRAM  08/06/2014  . INFLUENZA VACCINE  10/17/2016  . PAP SMEAR  09/23/2017  . TETANUS/TDAP  06/30/2018     Discussed health benefits of physical activity, and encouraged her to engage in regular exercise appropriate for her age and condition.    --------------------------------------------------------------------    Mar Daring, PA-C  [place]

## 2017-06-07 ENCOUNTER — Other Ambulatory Visit: Payer: Self-pay | Admitting: Physician Assistant

## 2017-06-07 DIAGNOSIS — I10 Essential (primary) hypertension: Secondary | ICD-10-CM

## 2017-06-13 ENCOUNTER — Other Ambulatory Visit: Payer: Self-pay | Admitting: Physician Assistant

## 2017-06-13 DIAGNOSIS — Z1231 Encounter for screening mammogram for malignant neoplasm of breast: Secondary | ICD-10-CM

## 2017-07-08 ENCOUNTER — Ambulatory Visit
Admission: RE | Admit: 2017-07-08 | Discharge: 2017-07-08 | Disposition: A | Discharge status: Home / Self Care | Payer: Managed Care, Other (non HMO) | Source: Ambulatory Visit | Attending: Physician Assistant | Admitting: Physician Assistant

## 2017-07-08 DIAGNOSIS — Z1231 Encounter for screening mammogram for malignant neoplasm of breast: Secondary | ICD-10-CM

## 2017-07-10 NOTE — Progress Notes (Signed)
Patient aware of normal mammogram and follow up recommendations.

## 2017-09-21 ENCOUNTER — Other Ambulatory Visit: Payer: Self-pay

## 2017-09-21 ENCOUNTER — Emergency Department: Payer: Managed Care, Other (non HMO)

## 2017-09-21 ENCOUNTER — Observation Stay
Admission: EM | Admit: 2017-09-21 | Discharge: 2017-09-22 | Disposition: A | Discharge status: Home / Self Care | Payer: Managed Care, Other (non HMO) | Attending: Surgery | Admitting: Surgery

## 2017-09-21 ENCOUNTER — Encounter: Payer: Self-pay | Admitting: Emergency Medicine

## 2017-09-21 DIAGNOSIS — R7309 Other abnormal glucose: Secondary | ICD-10-CM | POA: Insufficient documentation

## 2017-09-21 DIAGNOSIS — Z79899 Other long term (current) drug therapy: Secondary | ICD-10-CM | POA: Diagnosis not present

## 2017-09-21 DIAGNOSIS — K3589 Other acute appendicitis without perforation or gangrene: Principal | ICD-10-CM | POA: Insufficient documentation

## 2017-09-21 DIAGNOSIS — R1033 Periumbilical pain: Secondary | ICD-10-CM | POA: Insufficient documentation

## 2017-09-21 DIAGNOSIS — K210 Gastro-esophageal reflux disease with esophagitis: Secondary | ICD-10-CM | POA: Insufficient documentation

## 2017-09-21 DIAGNOSIS — Z6838 Body mass index (BMI) 38.0-38.9, adult: Secondary | ICD-10-CM | POA: Diagnosis not present

## 2017-09-21 DIAGNOSIS — I10 Essential (primary) hypertension: Secondary | ICD-10-CM | POA: Diagnosis not present

## 2017-09-21 DIAGNOSIS — K37 Unspecified appendicitis: Secondary | ICD-10-CM | POA: Insufficient documentation

## 2017-09-21 DIAGNOSIS — F329 Major depressive disorder, single episode, unspecified: Secondary | ICD-10-CM | POA: Diagnosis not present

## 2017-09-21 LAB — COMPREHENSIVE METABOLIC PANEL
ALK PHOS: 95 U/L (ref 38–126)
ALT: 17 U/L (ref 0–44)
ANION GAP: 9 (ref 5–15)
AST: 19 U/L (ref 15–41)
Albumin: 3.6 g/dL (ref 3.5–5.0)
BILIRUBIN TOTAL: 0.6 mg/dL (ref 0.3–1.2)
BUN: 11 mg/dL (ref 6–20)
CALCIUM: 8.8 mg/dL — AB (ref 8.9–10.3)
CO2: 25 mmol/L (ref 22–32)
Chloride: 105 mmol/L (ref 98–111)
Creatinine, Ser: 0.75 mg/dL (ref 0.44–1.00)
Glucose, Bld: 105 mg/dL — ABNORMAL HIGH (ref 70–99)
POTASSIUM: 3.7 mmol/L (ref 3.5–5.1)
Sodium: 139 mmol/L (ref 135–145)
TOTAL PROTEIN: 8 g/dL (ref 6.5–8.1)

## 2017-09-21 LAB — CBC
HEMATOCRIT: 37.9 % (ref 35.0–47.0)
HEMOGLOBIN: 13.4 g/dL (ref 12.0–16.0)
MCH: 30.6 pg (ref 26.0–34.0)
MCHC: 35.3 g/dL (ref 32.0–36.0)
MCV: 86.8 fL (ref 80.0–100.0)
Platelets: 253 10*3/uL (ref 150–440)
RBC: 4.37 MIL/uL (ref 3.80–5.20)
RDW: 14.5 % (ref 11.5–14.5)
WBC: 8.4 10*3/uL (ref 3.6–11.0)

## 2017-09-21 LAB — LIPASE, BLOOD: Lipase: 30 U/L (ref 11–51)

## 2017-09-21 LAB — URINALYSIS, COMPLETE (UACMP) WITH MICROSCOPIC
Bilirubin Urine: NEGATIVE
GLUCOSE, UA: NEGATIVE mg/dL
Ketones, ur: NEGATIVE mg/dL
LEUKOCYTES UA: NEGATIVE
Nitrite: NEGATIVE
PROTEIN: NEGATIVE mg/dL
Specific Gravity, Urine: 1.016 (ref 1.005–1.030)
pH: 5 (ref 5.0–8.0)

## 2017-09-21 LAB — POCT PREGNANCY, URINE: PREG TEST UR: NEGATIVE

## 2017-09-21 MED ORDER — METRONIDAZOLE IN NACL 5-0.79 MG/ML-% IV SOLN
500.0000 mg | Freq: Once | INTRAVENOUS | Status: AC
Start: 2017-09-22 — End: 2017-09-22
  Administered 2017-09-22: 500 mg via INTRAVENOUS
  Filled 2017-09-21: qty 100

## 2017-09-21 MED ORDER — PIPERACILLIN-TAZOBACTAM 3.375 G IVPB 30 MIN
3.3750 g | Freq: Once | INTRAVENOUS | Status: AC
  Administered 2017-09-22: 3.375 g via INTRAVENOUS
  Filled 2017-09-21: qty 50

## 2017-09-21 MED ORDER — IOHEXOL 300 MG/ML  SOLN
100.0000 mL | Freq: Once | INTRAMUSCULAR | Status: AC | PRN
  Administered 2017-09-21: 100 mL via INTRAVENOUS

## 2017-09-21 NOTE — ED Provider Notes (Signed)
[facility] Emergency Department Provider Note  ____________________________________________   I have reviewed the triage vital signs and the nursing notes.   HISTORY  Chief Complaint Abdominal Pain and Diarrhea   History limited by: Not Limited   HPI Anna [name] is a 47 y.o. female who presents to the emergency department today because of concerns for abdominal pain, nausea vomiting diarrhea.  Symptoms started yesterday.  The abdominal night initially was located in the center part of her abdomen.  Today however it is started to looking more in the right lower quadrant.  She initially had some diarrhea yesterday although states that that has gone away.  Additionally she has had some nausea.  She denies any fevers.  Denies any change in her appetite.   Per medical record review patient has a history of HTN  Past Medical History:  Diagnosis Date  . Hypertension     Patient Active Problem List   Diagnosis Date Noted  . Family history of breast cancer in first degree relative 12/07/2015  . Abnormal menses 01/12/2015  . Family history of ovarian cancer 01/12/2015  . BMI 35.0-35.9,adult 09/24/2014  . Adult-onset obesity 09/14/2014  . Absence of menstruation 09/14/2014  . Anxiety 09/14/2014  . Clinical depression 09/14/2014  . Elevated blood sugar 09/14/2014  . Esophagitis, reflux 09/14/2014  . Family history of colon cancer 09/14/2014  . Helicobacter pylori infection 09/14/2014  . Cephalalgia 09/14/2014  . Cold sore 09/14/2014  . Large breasts 09/14/2014  . Night sweat 09/14/2014  . Awareness of heartbeats 09/14/2014  . Borderline diabetes 09/14/2014  . Avitaminosis D 07/01/2008  . Hypercholesterolemia without hypertriglyceridemia 04/21/2003  . Benign essential HTN 04/16/2003    Past Surgical History:  Procedure Laterality Date  . NO PAST SURGERIES      Prior to Admission medications   Medication Sig Start Date End Date Taking? Authorizing  Provider  metoprolol succinate (TOPROL-XL) 50 MG 24 hr tablet TAKE 1 TABLET BY MOUTH  DAILY 06/08/17   Mar Daring, PA-C    Allergies Patient has no known allergies.  Family History  Problem Relation Age of Onset  . Stroke Mother   . Hypertension Mother   . Heart attack Mother   . Breast cancer Mother 71  . Colon cancer Father   . COPD Father   . Hypertension Father   . Heart disease Father   . Hypertension Sister   . Fibroids Sister   . Obesity Sister   . Ovarian cancer Sister 83  . Breast cancer Maternal Aunt   . Obesity Sister     Social History Social History   Tobacco Use  . Smoking status: Never Smoker  . Smokeless tobacco: Never Used  Substance Use Topics  . Alcohol use: Yes    Alcohol/week: 0.0 oz    Comment: OCCASIONALLY  . Drug use: No    Review of Systems Constitutional: No fever/chills Eyes: No visual changes. ENT: No sore throat. Cardiovascular: Denies chest pain. Respiratory: Denies shortness of breath. Gastrointestinal: Positive for abdominal pain, nausea vomiting and diarrhea. Genitourinary: Negative for dysuria. Musculoskeletal: Negative for back pain. Skin: Negative for rash. Neurological: Negative for headaches, focal weakness or numbness.  ____________________________________________   PHYSICAL EXAM:  VITAL SIGNS: ED Triage Vitals [09/21/17 1923]  Enc Vitals Group     BP (!) 157/86     Pulse Rate 74     Resp 18     Temp 98 F (36.7 C)     Temp Source  Oral     SpO2 99 %     Weight 207 lb (93.9 kg)     Height 5\' 2"  (1.575 m)     Head Circumference      Peak Flow      Pain Score 5   Constitutional: Alert and oriented.  Eyes: Conjunctivae are normal.  ENT      Head: Normocephalic and atraumatic.      Nose: No congestion/rhinnorhea.      Mouth/Throat: Mucous membranes are moist.      Neck: No stridor. Hematological/Lymphatic/Immunilogical: No cervical lymphadenopathy. Cardiovascular: Normal rate, regular rhythm.  No  murmurs, rubs, or gallops.  Respiratory: Normal respiratory effort without tachypnea nor retractions. Breath sounds are clear and equal bilaterally. No wheezes/rales/rhonchi. Gastrointestinal: Soft and tender to palpation in the right lower quadrant. No rebound. No guarding.  Genitourinary: Deferred Musculoskeletal: Normal range of motion in all extremities. No lower extremity edema. Neurologic:  Normal speech and language. No gross focal neurologic deficits are appreciated.  Skin:  Skin is warm, dry and intact. No rash noted. Psychiatric: Mood and affect are normal. Speech and behavior are normal. Patient exhibits appropriate insight and judgment.  ____________________________________________    LABS (pertinent positives/negatives)  UA hazy, small urine dipstick, negative nitrite, leukocytes, ketones. 0-5 rbc and wbc Upreg negative Lipase 30 CBC wbc 8.4, hgb 13.4, plt 253 CMP na 139, k 3.7, glu 105, cr 0.75  ____________________________________________   EKG  None  ____________________________________________    RADIOLOGY  CT abd/pel Acute appendicitis  ____________________________________________   PROCEDURES  Procedures  ____________________________________________   INITIAL IMPRESSION / ASSESSMENT AND PLAN / ED COURSE  Pertinent labs & imaging results that were available during my care of the patient were reviewed by me and considered in my medical decision making (see chart for details).   Patient presented to the emergency department today because of concerns for abdominal pain.  Initially was generalized however then presented in the right lower quadrant.  On exam patient is tender in the right lower quadrant.  Patient however without fever or leukocytosis.  I discussed with patient possibility of appendicitis.  Patient was okay to proceed with CT scan.  This did show acute appendicitis.  Dr. Rosana Hoes with surgery was  consulted.   ____________________________________________   FINAL CLINICAL IMPRESSION(S) / ED DIAGNOSES  Final diagnoses:  Appendicitis, unspecified appendicitis type     Note: This dictation was prepared with Dragon dictation. Any transcriptional errors that result from this process are unintentional     Nance Pear, MD 09/22/17 1523

## 2017-09-21 NOTE — ED Triage Notes (Signed)
Patient with complaint of pain at her umbilical area and diarrhea that started yesterday. Patient states that today the pain is in her right lower abdomen and continues to have diarrhea. Patient states that she has had some nausea but no vomiting. Patient denies urinary symptoms.

## 2017-09-22 ENCOUNTER — Emergency Department: Payer: Managed Care, Other (non HMO) | Admitting: Certified Registered"

## 2017-09-22 ENCOUNTER — Encounter
Admission: EM | Disposition: A | Discharge status: Home / Self Care | Payer: Self-pay | Source: Home / Self Care | Attending: Emergency Medicine

## 2017-09-22 ENCOUNTER — Encounter: Payer: Self-pay | Admitting: Anesthesiology

## 2017-09-22 DIAGNOSIS — K37 Unspecified appendicitis: Secondary | ICD-10-CM | POA: Diagnosis not present

## 2017-09-22 HISTORY — PX: LAPAROSCOPIC APPENDECTOMY: SHX408

## 2017-09-22 SURGERY — APPENDECTOMY, LAPAROSCOPIC
Anesthesia: General | Wound class: Clean Contaminated

## 2017-09-22 MED ORDER — MORPHINE SULFATE (PF) 2 MG/ML IV SOLN: 2.0000 mg | INTRAVENOUS | Status: DC | PRN

## 2017-09-22 MED ORDER — MIDAZOLAM HCL 2 MG/2ML IJ SOLN
INTRAMUSCULAR | Status: AC
  Filled 2017-09-22: qty 2

## 2017-09-22 MED ORDER — OXYCODONE-ACETAMINOPHEN 5-325 MG PO TABS: 1.0000 | ORAL_TABLET | ORAL | 0 refills | Status: DC | PRN

## 2017-09-22 MED ORDER — ACETAMINOPHEN 10 MG/ML IV SOLN
INTRAVENOUS | Status: AC
  Filled 2017-09-22: qty 100

## 2017-09-22 MED ORDER — MIDAZOLAM HCL 2 MG/2ML IJ SOLN
INTRAMUSCULAR | Status: DC | PRN
  Administered 2017-09-22: 2 mg via INTRAVENOUS

## 2017-09-22 MED ORDER — ROCURONIUM BROMIDE 100 MG/10ML IV SOLN
INTRAVENOUS | Status: DC | PRN
  Administered 2017-09-22: 5 mg via INTRAVENOUS
  Administered 2017-09-22: 25 mg via INTRAVENOUS

## 2017-09-22 MED ORDER — PROMETHAZINE HCL 25 MG/ML IJ SOLN: 6.2500 mg | INTRAMUSCULAR | Status: DC | PRN

## 2017-09-22 MED ORDER — LACTATED RINGERS IV SOLN
INTRAVENOUS | Status: DC | PRN
Start: 2017-09-22 — End: 2017-09-22
  Administered 2017-09-22 (×2): via INTRAVENOUS

## 2017-09-22 MED ORDER — SODIUM CHLORIDE 0.9 % IV SOLN
2.0000 g | INTRAVENOUS | Status: DC
  Administered 2017-09-22: 2 g via INTRAVENOUS
  Filled 2017-09-22: qty 2

## 2017-09-22 MED ORDER — ENOXAPARIN SODIUM 40 MG/0.4ML ~~LOC~~ SOLN: 40.0000 mg | SUBCUTANEOUS | Status: DC

## 2017-09-22 MED ORDER — LIDOCAINE HCL (PF) 2 % IJ SOLN
INTRAMUSCULAR | Status: AC
  Filled 2017-09-22: qty 10

## 2017-09-22 MED ORDER — ONDANSETRON HCL 4 MG/2ML IJ SOLN: 4.0000 mg | Freq: Four times a day (QID) | INTRAMUSCULAR | Status: DC | PRN

## 2017-09-22 MED ORDER — PROPOFOL 10 MG/ML IV BOLUS
INTRAVENOUS | Status: AC
  Filled 2017-09-22: qty 20

## 2017-09-22 MED ORDER — SUCCINYLCHOLINE CHLORIDE 20 MG/ML IJ SOLN
INTRAMUSCULAR | Status: DC | PRN
  Administered 2017-09-22: 100 mg via INTRAVENOUS

## 2017-09-22 MED ORDER — SUCCINYLCHOLINE CHLORIDE 20 MG/ML IJ SOLN
INTRAMUSCULAR | Status: AC
  Filled 2017-09-22: qty 1

## 2017-09-22 MED ORDER — PROPOFOL 10 MG/ML IV BOLUS
INTRAVENOUS | Status: DC | PRN
  Administered 2017-09-22: 200 mg via INTRAVENOUS

## 2017-09-22 MED ORDER — DEXAMETHASONE SODIUM PHOSPHATE 10 MG/ML IJ SOLN
INTRAMUSCULAR | Status: DC | PRN
  Administered 2017-09-22: 10 mg via INTRAVENOUS

## 2017-09-22 MED ORDER — LIDOCAINE HCL (CARDIAC) PF 100 MG/5ML IV SOSY
PREFILLED_SYRINGE | INTRAVENOUS | Status: DC | PRN
  Administered 2017-09-22: 100 mg via INTRAVENOUS

## 2017-09-22 MED ORDER — ONDANSETRON HCL 4 MG/2ML IJ SOLN
INTRAMUSCULAR | Status: AC
  Filled 2017-09-22: qty 2

## 2017-09-22 MED ORDER — DEXAMETHASONE SODIUM PHOSPHATE 10 MG/ML IJ SOLN
INTRAMUSCULAR | Status: AC
  Filled 2017-09-22: qty 1

## 2017-09-22 MED ORDER — FENTANYL CITRATE (PF) 100 MCG/2ML IJ SOLN
INTRAMUSCULAR | Status: AC
  Administered 2017-09-22: 25 ug via INTRAVENOUS
  Filled 2017-09-22: qty 2

## 2017-09-22 MED ORDER — SUGAMMADEX SODIUM 200 MG/2ML IV SOLN
INTRAVENOUS | Status: DC | PRN
  Administered 2017-09-22: 200 mg via INTRAVENOUS

## 2017-09-22 MED ORDER — ACETAMINOPHEN 10 MG/ML IV SOLN
INTRAVENOUS | Status: DC | PRN
  Administered 2017-09-22: 1000 mg via INTRAVENOUS

## 2017-09-22 MED ORDER — METRONIDAZOLE IN NACL 5-0.79 MG/ML-% IV SOLN
500.0000 mg | Freq: Three times a day (TID) | INTRAVENOUS | Status: DC
  Administered 2017-09-22: 500 mg via INTRAVENOUS
  Filled 2017-09-22 (×3): qty 100

## 2017-09-22 MED ORDER — ONDANSETRON HCL 4 MG/2ML IJ SOLN
INTRAMUSCULAR | Status: DC | PRN
  Administered 2017-09-22: 4 mg via INTRAVENOUS

## 2017-09-22 MED ORDER — ROCURONIUM BROMIDE 50 MG/5ML IV SOLN
INTRAVENOUS | Status: AC
  Filled 2017-09-22: qty 1

## 2017-09-22 MED ORDER — POTASSIUM CHLORIDE IN NACL 20-0.45 MEQ/L-% IV SOLN
INTRAVENOUS | Status: DC
  Administered 2017-09-22: 05:00:00 via INTRAVENOUS
  Filled 2017-09-22 (×3): qty 1000

## 2017-09-22 MED ORDER — FENTANYL CITRATE (PF) 100 MCG/2ML IJ SOLN
25.0000 ug | INTRAMUSCULAR | Status: DC | PRN
  Administered 2017-09-22 (×2): 25 ug via INTRAVENOUS

## 2017-09-22 MED ORDER — KETOROLAC TROMETHAMINE 30 MG/ML IJ SOLN
30.0000 mg | Freq: Four times a day (QID) | INTRAMUSCULAR | Status: DC
  Administered 2017-09-22: 30 mg via INTRAVENOUS
  Filled 2017-09-22: qty 1

## 2017-09-22 MED ORDER — FENTANYL CITRATE (PF) 100 MCG/2ML IJ SOLN
INTRAMUSCULAR | Status: AC
  Filled 2017-09-22: qty 2

## 2017-09-22 MED ORDER — METRONIDAZOLE IN NACL 5-0.79 MG/ML-% IV SOLN
INTRAVENOUS | Status: DC | PRN
  Administered 2017-09-22: 500 mg via INTRAVENOUS

## 2017-09-22 MED ORDER — ACETAMINOPHEN 500 MG PO TABS
1000.0000 mg | ORAL_TABLET | Freq: Four times a day (QID) | ORAL | Status: DC
  Administered 2017-09-22: 1000 mg via ORAL
  Filled 2017-09-22: qty 2

## 2017-09-22 MED ORDER — FENTANYL CITRATE (PF) 100 MCG/2ML IJ SOLN
INTRAMUSCULAR | Status: DC | PRN
  Administered 2017-09-22: 100 ug via INTRAVENOUS

## 2017-09-22 MED ORDER — OXYCODONE HCL 5 MG PO TABS: 5.0000 mg | ORAL_TABLET | ORAL | Status: DC | PRN

## 2017-09-22 MED ORDER — LIDOCAINE HCL 1 % IJ SOLN
INTRAMUSCULAR | Status: DC | PRN
  Administered 2017-09-22: 20 mL

## 2017-09-22 MED ORDER — SUGAMMADEX SODIUM 200 MG/2ML IV SOLN
INTRAVENOUS | Status: AC
  Filled 2017-09-22: qty 2

## 2017-09-22 MED ORDER — LIDOCAINE HCL (PF) 1 % IJ SOLN
INTRAMUSCULAR | Status: AC
  Filled 2017-09-22: qty 30

## 2017-09-22 MED ORDER — ONDANSETRON 4 MG PO TBDP: 4.0000 mg | ORAL_TABLET | Freq: Four times a day (QID) | ORAL | Status: DC | PRN

## 2017-09-22 SURGICAL SUPPLY — 40 items
BLADE SURG SZ11 CARB STEEL (BLADE) ×3 IMPLANT
CANISTER SUCT 3000ML PPV (MISCELLANEOUS) ×3 IMPLANT
CATH FOLEY 2WAY  5CC 16FR (CATHETERS)
CATH URTH 16FR FL 2W BLN LF (CATHETERS) IMPLANT
CHLORAPREP W/TINT 26ML (MISCELLANEOUS) ×3 IMPLANT
CUTTER FLEX LINEAR 45M (STAPLE) ×3 IMPLANT
DECANTER SPIKE VIAL GLASS SM (MISCELLANEOUS) ×3 IMPLANT
DERMABOND ADVANCED (GAUZE/BANDAGES/DRESSINGS) ×2
DERMABOND ADVANCED .7 DNX12 (GAUZE/BANDAGES/DRESSINGS) ×1 IMPLANT
ELECT REM PT RETURN 9FT ADLT (ELECTROSURGICAL) ×3
ELECTRODE REM PT RTRN 9FT ADLT (ELECTROSURGICAL) ×1 IMPLANT
GLOVE BIO SURGEON STRL SZ7 (GLOVE) ×3 IMPLANT
GLOVE BIOGEL PI IND STRL 7.5 (GLOVE) ×1 IMPLANT
GLOVE BIOGEL PI INDICATOR 7.5 (GLOVE) ×2
GOWN STRL REUS W/ TWL LRG LVL4 (GOWN DISPOSABLE) ×1 IMPLANT
GOWN STRL REUS W/ TWL XL LVL3 (GOWN DISPOSABLE) ×1 IMPLANT
GOWN STRL REUS W/TWL LRG LVL4 (GOWN DISPOSABLE) ×2
GOWN STRL REUS W/TWL XL LVL3 (GOWN DISPOSABLE) ×2
GRASPER SUT TROCAR 14GX15 (MISCELLANEOUS) ×3 IMPLANT
IRRIGATION STRYKERFLOW (MISCELLANEOUS) IMPLANT
IRRIGATOR STRYKERFLOW (MISCELLANEOUS)
KIT TURNOVER KIT A (KITS) ×3 IMPLANT
NEEDLE HYPO 22GX1.5 SAFETY (NEEDLE) ×3 IMPLANT
NEEDLE INSUFFLATION 14GA 120MM (NEEDLE) ×3 IMPLANT
NS IRRIG 1000ML POUR BTL (IV SOLUTION) ×3 IMPLANT
PACK LAP CHOLECYSTECTOMY (MISCELLANEOUS) ×3 IMPLANT
POUCH SPECIMEN RETRIEVAL 10MM (ENDOMECHANICALS) ×3 IMPLANT
RELOAD 45 VASCULAR/THIN (ENDOMECHANICALS) IMPLANT
RELOAD STAPLE TA45 3.5 REG BLU (ENDOMECHANICALS) ×3 IMPLANT
SHEARS HARMONIC ACE PLUS 36CM (ENDOMECHANICALS) ×3 IMPLANT
SLEEVE ENDOPATH XCEL 5M (ENDOMECHANICALS) ×3 IMPLANT
SOL .9 NS 3000ML IRR  AL (IV SOLUTION)
SOL .9 NS 3000ML IRR UROMATIC (IV SOLUTION) IMPLANT
SUT MNCRL AB 4-0 PS2 18 (SUTURE) ×3 IMPLANT
SUT VICRYL 0 UR6 27IN ABS (SUTURE) ×3 IMPLANT
SUT VICRYL AB 3-0 FS1 BRD 27IN (SUTURE) ×3 IMPLANT
TRAY FOLEY MTR SLVR 16FR STAT (SET/KITS/TRAYS/PACK) ×3 IMPLANT
TROCAR XCEL 12X100 BLDLESS (ENDOMECHANICALS) ×3 IMPLANT
TROCAR XCEL NON-BLD 5MMX100MML (ENDOMECHANICALS) ×3 IMPLANT
TUBING INSUFFLATION (TUBING) ×3 IMPLANT

## 2017-09-22 NOTE — Anesthesia Post-op Follow-up Note (Signed)
Anesthesia QCDR form completed.        

## 2017-09-22 NOTE — Anesthesia Procedure Notes (Addendum)
Procedure Name: Intubation Date/Time: 09/22/2017 1:41 AM Performed by: Sherrine Maples, CRNA Pre-anesthesia Checklist: Patient identified, Patient being monitored, Timeout performed, Emergency Drugs available and Suction available Patient Re-evaluated:Patient Re-evaluated prior to induction Oxygen Delivery Method: Circle system utilized Preoxygenation: Pre-oxygenation with 100% oxygen Induction Type: IV induction Laryngoscope Size: Miller and 2 Grade View: Grade I Tube type: Oral Tube size: 6.5 mm Number of attempts: 1 Airway Equipment and Method: Stylet Placement Confirmation: ETT inserted through vocal cords under direct vision,  positive ETCO2 and breath sounds checked- equal and bilateral Secured at: 21 cm Tube secured with: Tape Dental Injury: Teeth and Oropharynx as per pre-operative assessment  Comments: RSI with cricoid pressure

## 2017-09-22 NOTE — OR Nursing (Signed)
Patient family updated and notified and waiting in patient's room. Dr. Rosana Hoes spoke with family and patient.  Patient has been resting with eyes closed, tolerating liquids well and states her pain is a 8 out of 10 while sleeping.

## 2017-09-22 NOTE — Anesthesia Preprocedure Evaluation (Signed)
Anesthesia Evaluation  Patient identified by MRN, date of birth, ID band Patient awake    Reviewed: Allergy & Precautions, H&P , NPO status , Patient's Chart, lab work & pertinent test results, reviewed documented beta blocker date and time   History of Anesthesia Complications Negative for: history of anesthetic complications  Airway Mallampati: II  TM Distance: >3 FB Neck ROM: full    Dental  (+) Dental Advidsory Given, Caps, Implants, Teeth Intact   Pulmonary neg pulmonary ROS,           Cardiovascular Exercise Tolerance: Good hypertension, (-) angina(-) CAD, (-) Past MI, (-) Cardiac Stents and (-) CABG (-) dysrhythmias (-) Valvular Problems/Murmurs     Neuro/Psych PSYCHIATRIC DISORDERS Anxiety Depression negative neurological ROS     GI/Hepatic negative GI ROS, Neg liver ROS,   Endo/Other  negative endocrine ROS  Renal/GU negative Renal ROS  negative genitourinary   Musculoskeletal   Abdominal   Peds  Hematology negative hematology ROS (+)   Anesthesia Other Findings Past Medical History: No date: Hypertension   Reproductive/Obstetrics negative OB ROS                             Anesthesia Physical Anesthesia Plan  ASA: II  Anesthesia Plan: General   Post-op Pain Management:    Induction: Intravenous, Rapid sequence and Cricoid pressure planned  PONV Risk Score and Plan: 3 and Ondansetron and Dexamethasone  Airway Management Planned: Oral ETT  Additional Equipment:   Intra-op Plan:   Post-operative Plan: Extubation in OR  Informed Consent: I have reviewed the patients History and Physical, chart, labs and discussed the procedure including the risks, benefits and alternatives for the proposed anesthesia with the patient or authorized representative who has indicated his/her understanding and acceptance.   Dental Advisory Given  Plan Discussed with: Anesthesiologist,  CRNA and Surgeon  Anesthesia Plan Comments:         Anesthesia Quick Evaluation

## 2017-09-22 NOTE — Consult Note (Signed)
SURGICAL CONSULTATION NOTE (initial) - cpt: 06269  HISTORY OF PRESENT ILLNESS (HPI):  47 y.o. female presented to [facility] ED tonight for evaluation of abdominal pain. Patient reports she experienced diarrhea all day yesterday and through this morning, during which she first began developing generalized non-specific abdominal pain yesterday afternoon-evening, which then worsened and became focused this morning at her RLQ and continued to worsen with nausea but no emesis. She says her pain has been overall controlled while she's been in the ED tonight, but the pain has not improved or resolved. She otherwise denies fever/chills, CP, SOB, or any prior similar experiences. Patient says she has not eaten since yesterday.  Surgery is consulted by ED physician Dr. Archie Balboa in this context for evaluation and management of acute appendicitis.  PAST MEDICAL HISTORY (PMH):  Past Medical History:  Diagnosis Date  . Hypertension      PAST SURGICAL HISTORY ([place]):  Past Surgical History:  Procedure Laterality Date  . NO PAST SURGERIES       MEDICATIONS:  Prior to Admission medications   Medication Sig Start Date End Date Taking? Authorizing Provider  metoprolol succinate (TOPROL-XL) 50 MG 24 hr tablet TAKE 1 TABLET BY MOUTH  DAILY 06/08/17  Yes Mar Daring, PA-C     ALLERGIES:  No Known Allergies   SOCIAL HISTORY:  Social History   Socioeconomic History  . Marital status: Single    Spouse name: Not on file  . Number of children: Not on file  . Years of education: Not on file  . Highest education level: Not on file  Occupational History    Employer: [employer]  . Financial resource strain: Not on file  . Food insecurity:    Worry: Not on file    Inability: Not on file  . Transportation needs:    Medical: Not on file    Non-medical: Not on file  Tobacco Use  . Smoking status: Never Smoker  . Smokeless tobacco: Never Used  Substance and Sexual Activity  . Alcohol  use: Yes    Alcohol/week: 0.0 oz    Comment: OCCASIONALLY  . Drug use: No  . Sexual activity: Not on file  Lifestyle  . Physical activity:    Days per week: Not on file    Minutes per session: Not on file  . Stress: Not on file  Relationships  . Social connections:    Talks on phone: Not on file    Gets together: Not on file    Attends religious service: Not on file    Active member of club or organization: Not on file    Attends meetings of clubs or organizations: Not on file    Relationship status: Not on file  . Intimate partner violence:    Fear of current or ex partner: Not on file    Emotionally abused: Not on file    Physically abused: Not on file    Forced sexual activity: Not on file  Other Topics Concern  . Not on file  Social History Narrative  . Not on file    The patient currently resides (home / rehab facility / nursing home): Home The patient normally is (ambulatory / bedbound): Ambulatory   FAMILY HISTORY:  Family History  Problem Relation Age of Onset  . Stroke Mother   . Hypertension Mother   . Heart attack Mother   . Breast cancer Mother 5  . Colon cancer Father   . COPD Father   .  Hypertension Father   . Heart disease Father   . Hypertension Sister   . Fibroids Sister   . Obesity Sister   . Ovarian cancer Sister 44  . Breast cancer Maternal Aunt   . Obesity Sister      REVIEW OF SYSTEMS:  Constitutional: denies weight loss, fever, chills, or sweats  Eyes: denies any other vision changes, history of eye injury  ENT: denies sore throat, hearing problems  Respiratory: denies shortness of breath, wheezing  Cardiovascular: denies chest pain, palpitations  Gastrointestinal: abdominal pain, N/V, and bowel function as per HPI Genitourinary: denies burning with urination or urinary frequency Musculoskeletal: denies any other joint pains or cramps  Skin: denies any other rashes or skin discolorations  Neurological: denies any other headache,  dizziness, weakness  Psychiatric: denies any other depression, anxiety   All other review of systems were negative   VITAL SIGNS:  Temp:  [98 F (36.7 C)] 98 F (36.7 C) (07/06 1923) Pulse Rate:  [74-82] 82 (07/06 2223) Resp:  [16-18] 16 (07/06 2223) BP: (152-157)/(86) 152/86 (07/06 2223) SpO2:  [99 %] 99 % (07/06 2223) Weight:  [207 lb (93.9 kg)] 207 lb (93.9 kg) (07/06 1923)     Height: 5\' 2"  (157.5 cm) Weight: 207 lb (93.9 kg) BMI (Calculated): 37.85   INTAKE/OUTPUT:  This shift: No intake/output data recorded.  Last 2 shifts: @IOLAST2SHIFTS @   PHYSICAL EXAM:  Constitutional:  -- Obese body habitus  -- Awake, alert, and oriented x3, no apparent distress Eyes:  -- Pupils equally round and reactive to light  -- No scleral icterus, B/L no occular discharge Ear, nose, throat: -- Neck is FROM WNL -- No jugular venous distension  Pulmonary:  -- No wheezes or rhales -- Equal breath sounds bilaterally -- Breathing non-labored at rest Cardiovascular:  -- S1, S2 present  -- No pericardial rubs  Gastrointestinal:  -- Abdomen soft and obese, but non-distended, with moderately severe focal RLQ abdominal tenderness to palpation, no guarding or rebound tenderness -- No abdominal masses appreciated, pulsatile or otherwise  Musculoskeletal and Integumentary:  -- Wounds or skin discoloration: None appreciated -- Extremities: B/L UE and LE FROM, hands and feet warm, no edema  Neurologic:  -- Motor function: Intact and symmetric -- Sensation: Intact and symmetric Psychiatric:  -- Mood and affect WNL  Labs:  CBC Latest Ref Rng & Units 09/21/2017 01/03/2016 01/27/2015  WBC 3.6 - 11.0 K/uL 8.4 6.1 6.4  Hemoglobin 12.0 - 16.0 g/dL 13.4 11.6 12.3  Hematocrit 35.0 - 47.0 % 37.9 35.2 38.6  Platelets 150 - 440 K/uL 253 319 293   CMP Latest Ref Rng & Units 09/21/2017 01/27/2015  Glucose 70 - 99 mg/dL 105(H) 93  BUN 6 - 20 mg/dL 11 12  Creatinine 0.44 - 1.00 mg/dL 0.75 0.65  Sodium 135 -  145 mmol/L 139 140  Potassium 3.5 - 5.1 mmol/L 3.7 4.5  Chloride 98 - 111 mmol/L 105 101  CO2 22 - 32 mmol/L 25 23  Calcium 8.9 - 10.3 mg/dL 8.8(L) 9.2  Total Protein 6.5 - 8.1 g/dL 8.0 7.5  Total Bilirubin 0.3 - 1.2 mg/dL 0.6 0.3  Alkaline Phos 38 - 126 U/L 95 107  AST 15 - 41 U/L 19 20  ALT 0 - 44 U/L 17 14   Imaging studies:  CT Abdomen and Pelvis with Contrast (09/21/2017) - personally reviewed with patient and her family bedside Acute appendicitis.  Assessment/Plan: (ICD-10's: K35.30) 47 y.o. female with acute appendicitis, complicated by pertinent comorbidities  including morbid obesity (BMI 38), borderline DM, HTN, hypercholesterolemia, GERD with esophagitis and +h. Pylori, and major depression disorder.   - NPO, IV fluids   - pain control as needed  - ceftriaxone and metronidazole   - discussed possibilities of appendectomy vs observation considering likely early appendicitis and normal WBC  - all risks, benefits, and alternatives to appendectomy were discussed with the patient and her friends/family, all of their questions were answered to their expressed satisfaction, patient expresses she wishes to proceed, and informed consent was obtained.  - will plan for laparoscopic appendectomy tonight pending OR availability  All of the above findings and recommendations were discussed with the patient and her friends/family, and all of patient's and her friends/family's questions were answered to their expressed satisfaction.  Thank you for the opportunity to participate in this patient's care.   -- Marilynne Drivers Rosana Hoes, MD, [place]: [facility] - Partnering for exceptional care. Office: [phone]

## 2017-09-22 NOTE — Op Note (Addendum)
SURGICAL OPERATIVE REPORT  DATE OF PROCEDURE: 09/22/2017  ATTENDING Surgeon(s): Vickie Epley, MD  ANESTHESIA: GETA (General)  PRE-OPERATIVE DIAGNOSIS: Acute non-perforated appendicitis with localized peritonitis (K35.30)  POST-OPERATIVE DIAGNOSIS: Acute non-perforated appendicitis with localized peritonitis (K35.30) and possible PID-associated pelvic adhesions  PROCEDURE(S):  1.) Laparoscopic appendectomy (cpt: 24401)  INTRAOPERATIVE FINDINGS: Mildly inflamed non-perforated appendix with mild-/moderate- thickening of appendiceal base and B/L pelvic peri-colonic to anterior abdominal wall adhesions more closely resembling PID-associated pelvic adhesions than those that could be associated with appendicitis and sigmoid colonic diverticulitis (with no visualization of colonic diverticulosis)  INTRAVENOUS FLUIDS: 1200 mL crystalloid   ESTIMATED BLOOD LOSS: Minimal (<20 mL)  URINE OUTPUT: 100 mL   SPECIMENS: Appendix  IMPLANTS: None  DRAINS: None  COMPLICATIONS: None apparent  CONDITION AT END OF PROCEDURE: Hemodynamically stable and extubated  DISPOSITION OF PATIENT: PACU  INDICATIONS FOR PROCEDURE:  Patient is a 47 y.o. morbidly obese female who presented with acute onset of non-specific abdominal pain that progressed to become greatest at the Right lower quadrant following diarrhea and associated with nausea without emesis. All risks, benefits, and alternatives to appendectomy were discussed with the patient and her friends/family, all of patient's questions were answered to her expressed satisfaction, and informed consent was obtained and documented.  DETAILS OF PROCEDURE: Patient was brought to the operating suite and appropriately identified. General anesthesia was administered along with confirmation of appropriate pre-operative antibiotics, and endotracheal intubation was performed by anesthetist, along with NG/OG tube for gastric decompression. In supine position,  operative site was prepped and draped in usual sterile fashion, and following a brief time out, initial 5 mm incision was made in a natural skin crease just below the umbilicus. Fascia was then elevated, and a Verress needle was inserted and its proper position confirmed using saline meniscus test prior to abdominal insufflation.  Upon insufflation of the abdominal cavity with carbon dioxide to a well-tolerated pressure of 12-15 mmHg, a 5 mm peri-umbilical port followed by laparoscope were inserted and used to inspect the abdominal cavity and its contents with no injuries from insertion of the first trochar noted. Two additional trocars were inserted, a 12 mm port at the Left lower quadrant position and another 5 mm port at the suprapubic position. The table was then placed in Trendelenburg position with the Right side up, and blunt graspers were gently used to retract the bowel overlying a mildly inflamed appendix surrounded by mild peri-appendiceal inflammation. The appendix was gently retracted by near its tip, and the base of the appendix and mesoappendix were identified in relation to the cecum. The mesoappendix was dissected from the visceral appendix and hemostasis achieved using a harmonic scalpel. Upon freeing the visceral appendix from the mesoappendix, an endostapler loaded with a standard blue tissue load was advanced across the base of the visceral appendix, which was compressed for several seconds, and the stapler was deployed and removed from the abdominal cavity. Hemostasis was confirmed, and the specimen was extracted from the abdominal cavity in a laparoscopic specimen bag.  The intraperitoneal cavity was inspected with visualization of many filmy adhesions from B/L pelvic and B/L lower quadrant sigmoid colon and cecum to anterior abdominal wall. PMI laparoscopic fascial closure device was then used to re-approximate fascia at the 12 mm Left lower quadrant port site. All ports were then removed  under direct visualization, and the abdominal cavity was desuflated. All port sites were irrigated/cleaned, additional local anesthetic was injected at each incision, 3-0 Vicryl was used to re-approximate dermis  at 10/12 mm port site(s), and subcuticular 4-0 Monocryl suture was used to re-approximate skin. Skin was then cleaned, dried, and sterile skin glue was applied. Patient was then safely able to be awakened, extubated, and transferred to PACU for post-operative monitoring and care.   I was present for all aspects of the above procedure, and no operative complications were apparent.

## 2017-09-22 NOTE — Discharge Instructions (Signed)
In addition to included general post-operative instructions for Laparoscopic Appendectomy,  Diet: Resume home heart healthy diet.   Activity: No heavy lifting >20 pounds (children, pets, laundry, garbage) or strenuous activity until follow-up, but light activity and walking are encouraged. Do not drive or drink alcohol if taking narcotic pain medications.  Wound care: 2 days after surgery (Tuesday, 7/9), you may shower/get incision wet with soapy water and pat dry (do not rub incisions), but no baths or submerging incision underwater until follow-up.   Medications: Resume all home medications. For mild to moderate pain: acetaminophen (Tylenol) or ibuprofen/naproxen (if no kidney disease). Combining Tylenol with alcohol can substantially increase your risk of causing liver disease. Narcotic pain medications, if prescribed, can be used for severe pain, though may cause nausea, constipation, and drowsiness. Do not combine Tylenol and Percocet (or similar) within a 6 hour period as Percocet (and similar) contain(s) Tylenol. If you do not need the narcotic pain medication, you do not need to fill the prescription.  Call office [phone]) at any time if any questions, worsening pain, fevers/chills, bleeding, drainage from incision site, or other concerns.

## 2017-09-22 NOTE — Progress Notes (Signed)
Patient cleared for discharge to home.        Education complete. AVS printed. Discharge instructions given. All questions answered for patient clarification.  Prescriptions given, pharmacy verified.  IV removed.  Discharged to home Via  POV         

## 2017-09-22 NOTE — H&P (Signed)
SURGICAL ADMISSION HISTORY AND PHYSICAL  HISTORY OF PRESENT ILLNESS (HPI):  46 y.o. female presented to [facility] ED tonight for evaluation of abdominal pain. Patient reports she experienced diarrhea all day yesterday and through this morning, during which she first began developing generalized non-specific abdominal pain yesterday afternoon-evening, which then worsened and became focused this morning at her RLQ and continued to worsen with nausea but no emesis. She says her pain has been overall controlled while she's been in the ED tonight, but the pain has not improved or resolved. She otherwise denies fever/chills, CP, SOB, or any prior similar experiences. Patient says she has not eaten since yesterday.  Surgery is consulted by ED physician Dr. Archie Balboa in this context for evaluation and management of acute appendicitis.  PAST MEDICAL HISTORY (PMH):  Past Medical History:  Diagnosis Date  . Hypertension      PAST SURGICAL HISTORY ([place]):  Past Surgical History:  Procedure Laterality Date  . NO PAST SURGERIES       MEDICATIONS:  Prior to Admission medications   Medication Sig Start Date End Date Taking? Authorizing Provider  metoprolol succinate (TOPROL-XL) 50 MG 24 hr tablet TAKE 1 TABLET BY MOUTH  DAILY 06/08/17  Yes Mar Daring, PA-C     ALLERGIES:  No Known Allergies   SOCIAL HISTORY:  Social History   Socioeconomic History  . Marital status: Single    Spouse name: Not on file  . Number of children: Not on file  . Years of education: Not on file  . Highest education level: Not on file  Occupational History    Employer: [employer]  . Financial resource strain: Not on file  . Food insecurity:    Worry: Not on file    Inability: Not on file  . Transportation needs:    Medical: Not on file    Non-medical: Not on file  Tobacco Use  . Smoking status: Never Smoker  . Smokeless tobacco: Never Used  Substance and Sexual Activity  . Alcohol use: Yes   Alcohol/week: 0.0 oz    Comment: OCCASIONALLY  . Drug use: No  . Sexual activity: Not on file  Lifestyle  . Physical activity:    Days per week: Not on file    Minutes per session: Not on file  . Stress: Not on file  Relationships  . Social connections:    Talks on phone: Not on file    Gets together: Not on file    Attends religious service: Not on file    Active member of club or organization: Not on file    Attends meetings of clubs or organizations: Not on file    Relationship status: Not on file  . Intimate partner violence:    Fear of current or ex partner: Not on file    Emotionally abused: Not on file    Physically abused: Not on file    Forced sexual activity: Not on file  Other Topics Concern  . Not on file  Social History Narrative  . Not on file    The patient currently resides (home / rehab facility / nursing home): Home The patient normally is (ambulatory / bedbound): Ambulatory   FAMILY HISTORY:  Family History  Problem Relation Age of Onset  . Stroke Mother   . Hypertension Mother   . Heart attack Mother   . Breast cancer Mother 46  . Colon cancer Father   . COPD Father   . Hypertension Father   .  Heart disease Father   . Hypertension Sister   . Fibroids Sister   . Obesity Sister   . Ovarian cancer Sister 45  . Breast cancer Maternal Aunt   . Obesity Sister      REVIEW OF SYSTEMS:  Constitutional: denies weight loss, fever, chills, or sweats  Eyes: denies any other vision changes, history of eye injury  ENT: denies sore throat, hearing problems  Respiratory: denies shortness of breath, wheezing  Cardiovascular: denies chest pain, palpitations  Gastrointestinal: abdominal pain, N/V, and bowel function as per HPI Genitourinary: denies burning with urination or urinary frequency Musculoskeletal: denies any other joint pains or cramps  Skin: denies any other rashes or skin discolorations  Neurological: denies any other headache, dizziness,  weakness  Psychiatric: denies any other depression, anxiety   All other review of systems were negative   VITAL SIGNS:  Temp:  [98 F (36.7 C)] 98 F (36.7 C) (07/06 1923) Pulse Rate:  [74-82] 82 (07/06 2223) Resp:  [16-18] 16 (07/06 2223) BP: (152-157)/(86) 152/86 (07/06 2223) SpO2:  [99 %] 99 % (07/06 2223) Weight:  [207 lb (93.9 kg)] 207 lb (93.9 kg) (07/06 1923)     Height: 5\' 2"  (157.5 cm) Weight: 207 lb (93.9 kg) BMI (Calculated): 37.85   INTAKE/OUTPUT:  This shift: No intake/output data recorded.  Last 2 shifts: @IOLAST2SHIFTS @   PHYSICAL EXAM:  Constitutional:  -- Obese body habitus  -- Awake, alert, and oriented x3, no apparent distress Eyes:  -- Pupils equally round and reactive to light  -- No scleral icterus, B/L no occular discharge Ear, nose, throat: -- Neck is FROM WNL -- No jugular venous distension  Pulmonary:  -- No wheezes or rhales -- Equal breath sounds bilaterally -- Breathing non-labored at rest Cardiovascular:  -- S1, S2 present  -- No pericardial rubs  Gastrointestinal:  -- Abdomen soft and obese, but non-distended, with moderately severe focal RLQ abdominal tenderness to palpation, no guarding or rebound tenderness -- No abdominal masses appreciated, pulsatile or otherwise  Musculoskeletal and Integumentary:  -- Wounds or skin discoloration: None appreciated -- Extremities: B/L UE and LE FROM, hands and feet warm, no edema  Neurologic:  -- Motor function: Intact and symmetric -- Sensation: Intact and symmetric Psychiatric:  -- Mood and affect WNL  Labs:  CBC Latest Ref Rng & Units 09/21/2017 01/03/2016 01/27/2015  WBC 3.6 - 11.0 K/uL 8.4 6.1 6.4  Hemoglobin 12.0 - 16.0 g/dL 13.4 11.6 12.3  Hematocrit 35.0 - 47.0 % 37.9 35.2 38.6  Platelets 150 - 440 K/uL 253 319 293   CMP Latest Ref Rng & Units 09/21/2017 01/27/2015  Glucose 70 - 99 mg/dL 105(H) 93  BUN 6 - 20 mg/dL 11 12  Creatinine 0.44 - 1.00 mg/dL 0.75 0.65  Sodium 135 - 145 mmol/L  139 140  Potassium 3.5 - 5.1 mmol/L 3.7 4.5  Chloride 98 - 111 mmol/L 105 101  CO2 22 - 32 mmol/L 25 23  Calcium 8.9 - 10.3 mg/dL 8.8(L) 9.2  Total Protein 6.5 - 8.1 g/dL 8.0 7.5  Total Bilirubin 0.3 - 1.2 mg/dL 0.6 0.3  Alkaline Phos 38 - 126 U/L 95 107  AST 15 - 41 U/L 19 20  ALT 0 - 44 U/L 17 14   Imaging studies:  CT Abdomen and Pelvis with Contrast (09/21/2017) - personally reviewed with patient and her family bedside Acute appendicitis.  Assessment/Plan: (ICD-10's: K35.30) 47 y.o. female with acute appendicitis, complicated by pertinent comorbidities including morbid obesity (BMI 38),  borderline DM, HTN, hypercholesterolemia, GERD with esophagitis and +h. Pylori, and major depression disorder.   - NPO, IV fluids   - pain control as needed  - ceftriaxone and metronidazole   - discussed possibilities of appendectomy vs observation considering likely early appendicitis and normal WBC  - all risks, benefits, and alternatives to appendectomy were discussed with the patient and her friends/family, all of their questions were answered to their expressed satisfaction, patient expresses she wishes to proceed, and informed consent was obtained.  - will plan for laparoscopic appendectomy tonight pending OR availability  All of the above findings and recommendations were discussed with the patient and her friends/family, and all of patient's and her friends/family's questions were answered to their expressed satisfaction.  -- Marilynne Drivers Rosana Hoes, MD, [place]: [facility] - Partnering for exceptional care. Office: [phone]

## 2017-09-22 NOTE — Transfer of Care (Signed)
Immediate Anesthesia Transfer of Care Note  Patient: Anna [name]  Procedure(s) Performed: APPENDECTOMY LAPAROSCOPIC (N/A )  Patient Location: PACU  Anesthesia Type:General  Level of Consciousness: drowsy and patient cooperative  Airway & Oxygen Therapy: Patient Spontanous Breathing and Patient connected to nasal cannula oxygen  Post-op Assessment: Report given to RN and Post -op Vital signs reviewed and stable  Post vital signs: Reviewed and stable  Last Vitals:  Vitals Value Taken Time  BP    Temp    Pulse 81 09/22/2017  3:07 AM  Resp 15 09/22/2017  3:07 AM  SpO2 100 % 09/22/2017  3:07 AM  Vitals shown include unvalidated device data.  Last Pain:  Vitals:   09/22/17 0014  TempSrc:   PainSc: 5          Complications: No apparent anesthesia complications

## 2017-09-23 ENCOUNTER — Encounter: Payer: Self-pay | Admitting: Surgery

## 2017-09-23 NOTE — Anesthesia Postprocedure Evaluation (Signed)
Anesthesia Post Note  Patient: Anna [name]  Procedure(s) Performed: APPENDECTOMY LAPAROSCOPIC (N/A )  Patient location during evaluation: PACU Anesthesia Type: General Level of consciousness: awake and alert Pain management: pain level controlled Vital Signs Assessment: post-procedure vital signs reviewed and stable Respiratory status: spontaneous breathing, nonlabored ventilation, respiratory function stable and patient connected to nasal cannula oxygen Cardiovascular status: blood pressure returned to baseline and stable Postop Assessment: no apparent nausea or vomiting Anesthetic complications: no     Last Vitals:  Vitals:   09/22/17 0450 09/22/17 0556  BP: 112/75 118/69  Pulse: 65 67  Resp: 17 16  Temp: 36.7 C 36.4 C  SpO2: 97% 96%    Last Pain:  Vitals:   09/22/17 0702  TempSrc:   PainSc: Asleep                 Martha Clan

## 2017-09-24 LAB — SURGICAL PATHOLOGY

## 2017-09-30 ENCOUNTER — Telehealth: Payer: Self-pay

## 2017-09-30 NOTE — Telephone Encounter (Signed)
FMLA completed and faxed to [phone]. Copy made and placed in scan folder. Patient notified.

## 2017-10-01 ENCOUNTER — Ambulatory Visit (INDEPENDENT_AMBULATORY_CARE_PROVIDER_SITE_OTHER): Payer: Managed Care, Other (non HMO) | Admitting: Surgery

## 2017-10-01 DIAGNOSIS — K3580 Unspecified acute appendicitis: Secondary | ICD-10-CM

## 2017-10-01 DIAGNOSIS — Z4889 Encounter for other specified surgical aftercare: Secondary | ICD-10-CM

## 2017-10-01 NOTE — Progress Notes (Signed)
Surgical Clinic Progress/Follow-up Note   HPI:  47 y.o. Female presents to clinic for post-op follow-up 9 Days s/p laparoscopic appendectomy with intra-operative findings of B/L pelvic adhesions concerning for history of PID Rosana Hoes, 09/22/2017). Patient reports complete resolution of pre-operative pain and has been tolerating regular diet with +flatus and normal BM's, denies N/V, fever/chills, CP, or SOB.  Review of Systems:  Constitutional: denies fever/chills  Respiratory: denies shortness of breath, wheezing  Cardiovascular: denies chest pain, palpitations  Gastrointestinal: abdominal pain, N/V, and bowel function as per interval history Skin: Denies any other rashes or skin discolorations except post-surgical wounds as per interval history  Vital Signs:  There were no vitals taken for this visit.   Physical Exam:  Constitutional:  -- Obese body habitus  -- Awake, alert, and oriented x3  Pulmonary:  -- No crackles -- Equal breath sounds bilaterally -- Breathing non-labored at rest Cardiovascular:  -- S1, S2 present  -- No pericardial rubs  Gastrointestinal:  -- Soft and non-distended with mild peri-incisional tenderness to palpation, no guarding/rebound tenderness -- Post-surgical incisions all well-approximated without any peri-incisional erythema or drainage -- No abdominal masses appreciated, pulsatile or otherwise  Musculoskeletal / Integumentary:  -- Wounds or skin discoloration: None appreciated except post-surgical incisions as described above (GI) -- Extremities: B/L UE and LE FROM, hands and feet warm, no edema   Assessment:  47 y.o. yo Female with a problem list including...  Patient Active Problem List   Diagnosis Date Noted  . Acute appendicitis 09/22/2017  . Family history of breast cancer in first degree relative 12/07/2015  . Abnormal menses 01/12/2015  . Family history of ovarian cancer 01/12/2015  . BMI 35.0-35.9,adult 09/24/2014  . Adult-onset obesity  09/14/2014  . Absence of menstruation 09/14/2014  . Anxiety 09/14/2014  . Clinical depression 09/14/2014  . Elevated blood sugar 09/14/2014  . Esophagitis, reflux 09/14/2014  . Family history of colon cancer 09/14/2014  . Helicobacter pylori infection 09/14/2014  . Cephalalgia 09/14/2014  . Cold sore 09/14/2014  . Large breasts 09/14/2014  . Night sweat 09/14/2014  . Awareness of heartbeats 09/14/2014  . Borderline diabetes 09/14/2014  . Avitaminosis D 07/01/2008  . Hypercholesterolemia without hypertriglyceridemia 04/21/2003  . Benign essential HTN 04/16/2003    presents to clinic for post-op follow-up evaluation, doing well 9 Days s/p laparoscopic appendectomy Rosana Hoes, 09/22/2017) for acute appendicitis.  Plan:              - advance diet as tolerated  - no heavy lifting >15 - 20 lbs x 1 more week  - continue to avoid submerging incisions under water such as bath/swimming x 1 more week             - after next week, okay to submerge incisions under water (baths, swimming) as per patient             - after next week, gradually resume all activities without restrictions over the following 2 weeks             - apply sunblock particularly to incisions with sun exposure to reduce pigmentation of scars  - again discussed B/L lower abdominal/pelvic adhesions visualized during recent surgery             - return to clinic as needed, instructed to call office if any questions or concerns  All of the above recommendations were discussed with the patient, and all of patient's questions were answered to her expressed satisfaction.  -- Marilynne Drivers Rosana Hoes,  MD, RPVI [place]: [facility] - Partnering for exceptional care. Office: [phone]

## 2017-10-01 NOTE — Patient Instructions (Signed)

## 2017-10-02 ENCOUNTER — Encounter: Payer: Self-pay | Admitting: Surgery

## 2017-10-02 NOTE — Discharge Summary (Signed)
Physician Discharge Summary  Patient ID: Anna [name] MRN: [id number] DOB/AGE: 1970/05/03 47 y.o.  Admit date: 09/21/2017 Discharge date: 09/22/2017  Admission Diagnoses:  Discharge Diagnoses:  Active Problems:   Acute appendicitis   Discharged Condition: good  Hospital Course: 47 y.o. female presented to [facility] ED for abdominal pain. Workup was found to be significant for CT imaging demonstrating acute appendicitis. Informed consent was obtained and documented, and patient underwent laparoscopic appendectomy Rosana Hoes, 09/22/2017).  Post-operatively, patient's pain improved/resolved and advancement of patient's diet and ambulation were well-tolerated. The remainder of patient's hospital course was essentially unremarkable, and discharge planning was initiated accordingly with patient safely able to be discharged home with appropriate discharge instructions, pain control, and outpatient surgical follow-up after all of her questions were answered to her expressed satisfaction.  Consults: None  Significant Diagnostic Studies: radiology: CT scan: acute appendicitis  Treatments: IV hydration, antibiotics: ceftriaxone and metronidazole and surgery: laparoscopic appendectomy Rosana Hoes, 09/22/2017)  Discharge Exam: Blood pressure 118/69, pulse 67, temperature 97.6 F (36.4 C), temperature source Oral, resp. rate 16, height 5\' 2"  (1.575 m), weight 213 lb 4.8 oz (96.8 kg), last menstrual period 02/06/2017, SpO2 96 %. General appearance: alert, cooperative and no distress GI: abdomen soft and non-distended with minimal peri-incisional tenderness to palpation, incisions well-approximated without any surrounding erythema or drainage  Disposition:    Allergies as of 09/22/2017   No Known Allergies     Medication List    TAKE these medications   metoprolol succinate 50 MG 24 hr tablet Commonly known as:  TOPROL-XL TAKE 1 TABLET BY MOUTH  DAILY   oxyCODONE-acetaminophen 5-325 MG tablet Commonly known as:   PERCOCET/ROXICET Take 1 tablet by mouth every 4 (four) hours as needed for severe pain.      Follow-up Information    Vickie Epley, MD. Schedule an appointment as soon as possible for a visit in 2 week(s).   Specialty:  General Surgery Contact information: [address] [place] [address] [phone]           Signed: Vickie Epley 10/02/2017, 9:30 PM

## 2017-11-11 ENCOUNTER — Other Ambulatory Visit: Payer: Self-pay | Admitting: Physician Assistant

## 2017-11-11 DIAGNOSIS — I10 Essential (primary) hypertension: Secondary | ICD-10-CM

## 2017-12-03 ENCOUNTER — Encounter: Payer: Self-pay | Admitting: *Deleted

## 2017-12-03 ENCOUNTER — Other Ambulatory Visit: Payer: Self-pay

## 2017-12-04 ENCOUNTER — Ambulatory Visit: Payer: Managed Care, Other (non HMO) | Admitting: Anesthesiology

## 2017-12-04 ENCOUNTER — Ambulatory Visit
Admission: RE | Admit: 2017-12-04 | Discharge: 2017-12-04 | Disposition: A | Discharge status: Home / Self Care | Payer: Managed Care, Other (non HMO) | Source: Ambulatory Visit | Attending: Surgery | Admitting: Surgery

## 2017-12-04 ENCOUNTER — Encounter
Admission: RE | Disposition: A | Discharge status: Home / Self Care | Payer: Self-pay | Source: Ambulatory Visit | Attending: Surgery

## 2017-12-04 DIAGNOSIS — I10 Essential (primary) hypertension: Secondary | ICD-10-CM | POA: Insufficient documentation

## 2017-12-04 DIAGNOSIS — Z6837 Body mass index (BMI) 37.0-37.9, adult: Secondary | ICD-10-CM | POA: Diagnosis not present

## 2017-12-04 DIAGNOSIS — D1611 Benign neoplasm of short bones of right upper limb: Secondary | ICD-10-CM | POA: Diagnosis not present

## 2017-12-04 DIAGNOSIS — Z79899 Other long term (current) drug therapy: Secondary | ICD-10-CM | POA: Insufficient documentation

## 2017-12-04 HISTORY — PX: FINGER ARTHROSCOPY WITH EXTENSIVE DEBRIDEMENT: SHX5630

## 2017-12-04 SURGERY — FINGER ARTHROSCOPY WITH EXTENSIVE DEBRIDEMENT
Anesthesia: General | Site: Finger | Laterality: Right

## 2017-12-04 MED ORDER — DEXAMETHASONE SODIUM PHOSPHATE 4 MG/ML IJ SOLN
INTRAMUSCULAR | Status: DC | PRN
  Administered 2017-12-04: 4 mg via INTRAVENOUS

## 2017-12-04 MED ORDER — GLYCOPYRROLATE 0.2 MG/ML IJ SOLN
INTRAMUSCULAR | Status: DC | PRN
  Administered 2017-12-04: 0.1 mg via INTRAVENOUS

## 2017-12-04 MED ORDER — METOCLOPRAMIDE HCL 5 MG PO TABS: 5.0000 mg | ORAL_TABLET | Freq: Three times a day (TID) | ORAL | Status: DC | PRN

## 2017-12-04 MED ORDER — BUPIVACAINE HCL (PF) 0.5 % IJ SOLN
INTRAMUSCULAR | Status: DC | PRN
  Administered 2017-12-04: 10 mL

## 2017-12-04 MED ORDER — LACTATED RINGERS IV SOLN
INTRAVENOUS | Status: DC
  Administered 2017-12-04: 11:00:00 via INTRAVENOUS

## 2017-12-04 MED ORDER — ONDANSETRON HCL 4 MG/2ML IJ SOLN
INTRAMUSCULAR | Status: DC | PRN
  Administered 2017-12-04: 4 mg via INTRAVENOUS

## 2017-12-04 MED ORDER — FENTANYL CITRATE (PF) 100 MCG/2ML IJ SOLN
INTRAMUSCULAR | Status: DC | PRN
  Administered 2017-12-04: 50 ug via INTRAVENOUS
  Administered 2017-12-04: 12.5 ug via INTRAVENOUS

## 2017-12-04 MED ORDER — ACETAMINOPHEN 160 MG/5ML PO SOLN: 325.0000 mg | ORAL | Status: DC | PRN

## 2017-12-04 MED ORDER — EPHEDRINE SULFATE 50 MG/ML IJ SOLN
INTRAMUSCULAR | Status: DC | PRN
  Administered 2017-12-04 (×4): 5 mg via INTRAVENOUS

## 2017-12-04 MED ORDER — POTASSIUM CHLORIDE IN NACL 20-0.9 MEQ/L-% IV SOLN: INTRAVENOUS | Status: DC

## 2017-12-04 MED ORDER — SCOPOLAMINE 1 MG/3DAYS TD PT72
1.0000 | MEDICATED_PATCH | Freq: Once | TRANSDERMAL | Status: DC
  Administered 2017-12-04: 1.5 mg via TRANSDERMAL

## 2017-12-04 MED ORDER — ONDANSETRON HCL 4 MG/2ML IJ SOLN: 4.0000 mg | Freq: Once | INTRAMUSCULAR | Status: DC | PRN

## 2017-12-04 MED ORDER — ONDANSETRON HCL 4 MG PO TABS: 4.0000 mg | ORAL_TABLET | Freq: Four times a day (QID) | ORAL | Status: DC | PRN

## 2017-12-04 MED ORDER — OXYCODONE HCL 5 MG PO TABS: 5.0000 mg | ORAL_TABLET | Freq: Once | ORAL | Status: DC | PRN

## 2017-12-04 MED ORDER — METOCLOPRAMIDE HCL 5 MG/ML IJ SOLN: 5.0000 mg | Freq: Three times a day (TID) | INTRAMUSCULAR | Status: DC | PRN

## 2017-12-04 MED ORDER — LIDOCAINE HCL (CARDIAC) PF 100 MG/5ML IV SOSY
PREFILLED_SYRINGE | INTRAVENOUS | Status: DC | PRN
  Administered 2017-12-04: 40 mg via INTRATRACHEAL

## 2017-12-04 MED ORDER — FENTANYL CITRATE (PF) 100 MCG/2ML IJ SOLN: 25.0000 ug | INTRAMUSCULAR | Status: DC | PRN

## 2017-12-04 MED ORDER — ACETAMINOPHEN 325 MG PO TABS: 325.0000 mg | ORAL_TABLET | ORAL | Status: DC | PRN

## 2017-12-04 MED ORDER — OXYCODONE HCL 5 MG/5ML PO SOLN: 5.0000 mg | Freq: Once | ORAL | Status: DC | PRN

## 2017-12-04 MED ORDER — MIDAZOLAM HCL 5 MG/5ML IJ SOLN
INTRAMUSCULAR | Status: DC | PRN
  Administered 2017-12-04: 2 mg via INTRAVENOUS

## 2017-12-04 MED ORDER — PROPOFOL 10 MG/ML IV BOLUS
INTRAVENOUS | Status: DC | PRN
  Administered 2017-12-04: 130 mg via INTRAVENOUS

## 2017-12-04 MED ORDER — ONDANSETRON HCL 4 MG/2ML IJ SOLN: 4.0000 mg | Freq: Four times a day (QID) | INTRAMUSCULAR | Status: DC | PRN

## 2017-12-04 MED ORDER — CEFAZOLIN SODIUM-DEXTROSE 2-4 GM/100ML-% IV SOLN
2.0000 g | Freq: Once | INTRAVENOUS | Status: AC
  Administered 2017-12-04: 2 g via INTRAVENOUS

## 2017-12-04 MED ORDER — HYDROCODONE-ACETAMINOPHEN 5-325 MG PO TABS: 1.0000 | ORAL_TABLET | ORAL | Status: DC | PRN

## 2017-12-04 SURGICAL SUPPLY — 31 items
BANDAGE ELASTIC 2 LF NS (GAUZE/BANDAGES/DRESSINGS) ×3 IMPLANT
BNDG COHESIVE 4X5 TAN STRL (GAUZE/BANDAGES/DRESSINGS) ×2 IMPLANT
BNDG ESMARK 4X12 TAN STRL LF (GAUZE/BANDAGES/DRESSINGS) ×3 IMPLANT
BNDG GAUZE 1X2.1 STRL (MISCELLANEOUS) ×2 IMPLANT
BONE CANC CHIPS 15CC PCAN15 14 (Bone Implant) ×3 IMPLANT
CHIPS CANC BONE 15CC PCAN15 14 (Bone Implant) ×1 IMPLANT
CHLORAPREP W/TINT 26ML (MISCELLANEOUS) ×3 IMPLANT
CORD BIP STRL DISP 12FT (MISCELLANEOUS) ×3 IMPLANT
COVER LIGHT HANDLE UNIVERSAL (MISCELLANEOUS) ×6 IMPLANT
DECANTER SPIKE VIAL GLASS SM (MISCELLANEOUS) ×3 IMPLANT
GAUZE PETRO XEROFOAM 1X8 (MISCELLANEOUS) ×3 IMPLANT
GAUZE SPONGE 4X4 12PLY STRL (GAUZE/BANDAGES/DRESSINGS) ×3 IMPLANT
GLOVE BIO SURGEON STRL SZ8 (GLOVE) ×6 IMPLANT
GLOVE INDICATOR 8.0 STRL GRN (GLOVE) ×5 IMPLANT
GOWN STRL REUS W/ TWL LRG LVL3 (GOWN DISPOSABLE) ×1 IMPLANT
GOWN STRL REUS W/ TWL XL LVL3 (GOWN DISPOSABLE) ×1 IMPLANT
GOWN STRL REUS W/TWL LRG LVL3 (GOWN DISPOSABLE) ×2
GOWN STRL REUS W/TWL XL LVL3 (GOWN DISPOSABLE) ×2
GRAFT BNE CANC CHIPS 1-4 15CC (Bone Implant) IMPLANT
GRAFT DMB PUTTY 1 OPTIUM FD (Bone Implant) IMPLANT
K-WIRE DBL END TROCAR 6X.045 (WIRE) ×3
KIT TURNOVER KIT A (KITS) ×3 IMPLANT
KWIRE DBL END TROCAR 6X.045 (WIRE) IMPLANT
NS IRRIG 500ML POUR BTL (IV SOLUTION) ×3 IMPLANT
PACK EXTREMITY ARMC (MISCELLANEOUS) ×3 IMPLANT
PUTTY DBM OPTIUM 1CC (Bone Implant) ×3 IMPLANT
STOCKINETTE IMPERVIOUS 9X36 MD (GAUZE/BANDAGES/DRESSINGS) ×3 IMPLANT
SUT PROLENE 4 0 PS 2 18 (SUTURE) ×5 IMPLANT
SUT VIC AB 3-0 SH 27 (SUTURE)
SUT VIC AB 3-0 SH 27X BRD (SUTURE) IMPLANT
SUT VIC AB 4-0 FS2 27 (SUTURE) ×2 IMPLANT

## 2017-12-04 NOTE — Anesthesia Postprocedure Evaluation (Signed)
Anesthesia Post Note  Patient: Anna [name]  Procedure(s) Performed: DEBRIDEMENT WITH BONE GRAFTING OF A PRESUMED ENCHONDROMA OF THE RIGHT LING MIDDLE PHALANX (Right Finger)  Patient location during evaluation: PACU Anesthesia Type: General Level of consciousness: awake and alert and oriented Pain management: satisfactory to patient Vital Signs Assessment: post-procedure vital signs reviewed and stable Respiratory status: spontaneous breathing, nonlabored ventilation and respiratory function stable Cardiovascular status: blood pressure returned to baseline and stable Postop Assessment: Adequate PO intake and No signs of nausea or vomiting Anesthetic complications: no    Raliegh Ip

## 2017-12-04 NOTE — Anesthesia Preprocedure Evaluation (Signed)
Anesthesia Evaluation  Patient identified by MRN, date of birth, ID band Patient awake    Reviewed: Allergy & Precautions, H&P , NPO status , Patient's Chart, lab work & pertinent test results  Airway Mallampati: II  TM Distance: >3 FB Neck ROM: full    Dental no notable dental hx.    Pulmonary    Pulmonary exam normal breath sounds clear to auscultation       Cardiovascular hypertension, Normal cardiovascular exam Rhythm:regular Rate:Normal     Neuro/Psych    GI/Hepatic   Endo/Other  Morbid obesity  Renal/GU      Musculoskeletal   Abdominal   Peds  Hematology   Anesthesia Other Findings   Reproductive/Obstetrics                             Anesthesia Physical Anesthesia Plan  ASA: II  Anesthesia Plan: General   Post-op Pain Management:    Induction: Intravenous  PONV Risk Score and Plan:   Airway Management Planned: LMA  Additional Equipment:   Intra-op Plan:   Post-operative Plan:   Informed Consent: I have reviewed the patients History and Physical, chart, labs and discussed the procedure including the risks, benefits and alternatives for the proposed anesthesia with the patient or authorized representative who has indicated his/her understanding and acceptance.     Plan Discussed with: CRNA  Anesthesia Plan Comments:         Anesthesia Quick Evaluation

## 2017-12-04 NOTE — Anesthesia Procedure Notes (Signed)
Procedure Name: LMA Insertion Date/Time: 12/04/2017 12:07 PM Performed by: Mayme Genta, CRNA Pre-anesthesia Checklist: Patient identified, Emergency Drugs available, Suction available, Timeout performed and Patient being monitored Patient Re-evaluated:Patient Re-evaluated prior to induction Oxygen Delivery Method: Circle system utilized Preoxygenation: Pre-oxygenation with 100% oxygen Induction Type: IV induction LMA: LMA inserted LMA Size: 4.0 Number of attempts: 1 Placement Confirmation: positive ETCO2 and breath sounds checked- equal and bilateral Tube secured with: Tape

## 2017-12-04 NOTE — Discharge Instructions (Signed)
General Anesthesia, Adult, Care After These instructions provide you with information about caring for yourself after your procedure. Your health care provider may also give you more specific instructions. Your treatment has been planned according to current medical practices, but problems sometimes occur. Call your health care provider if you have any problems or questions after your procedure. What can I expect after the procedure? After the procedure, it is common to have:  Vomiting.  A sore throat.  Mental slowness.  It is common to feel:  Nauseous.  Cold or shivery.  Sleepy.  Tired.  Sore or achy, even in parts of your body where you did not have surgery.  Follow these instructions at home: For at least 24 hours after the procedure:  Do not: ? Participate in activities where you could fall or become injured. ? Drive. ? Use heavy machinery. ? Drink alcohol. ? Take sleeping pills or medicines that cause drowsiness. ? Make important decisions or sign legal documents. ? Take care of children on your own.  Rest. Eating and drinking  If you vomit, drink water, juice, or soup when you can drink without vomiting.  Drink enough fluid to keep your urine clear or pale yellow.  Make sure you have little or no nausea before eating solid foods.  Follow the diet recommended by your health care provider. General instructions  Have a responsible adult stay with you until you are awake and alert.  Return to your normal activities as told by your health care provider. Ask your health care provider what activities are safe for you.  Take over-the-counter and prescription medicines only as told by your health care provider.  If you smoke, do not smoke without supervision.  Keep all follow-up visits as told by your health care provider. This is important. Contact a health care provider if:  You continue to have nausea or vomiting at home, and medicines are not helpful.  You  cannot drink fluids or start eating again.  You cannot urinate after 8-12 hours.  You develop a skin rash.  You have fever.  You have increasing redness at the site of your procedure. Get help right away if:  You have difficulty breathing.  You have chest pain.  You have unexpected bleeding.  You feel that you are having a life-threatening or urgent problem. This information is not intended to replace advice given to you by your health care provider. Make sure you discuss any questions you have with your health care provider. Document Released: 06/11/2000 Document Revised: 08/08/2015 Document Reviewed: 02/17/2015 Elsevier Interactive Patient Education  2018 [place] discharge instructions: Keep dressing/splint dry and intact. Keep hand elevated above heart level. Apply ice to affected area frequently. Take ibuprofen 600-800 mg TID with meals for 7-10 days, then as necessary. Take pain medication as prescribed or ES Tylenol when needed.  Return for follow-up in 10-14 days or as scheduled.

## 2017-12-04 NOTE — H&P (Signed)
Paper H&P to be scanned into permanent record. H&P reviewed and patient re-examined. No changes. 

## 2017-12-04 NOTE — Op Note (Signed)
12/04/2017  1:11 PM  Patient:   MARDELLA NUCKLES  Pre-Op Diagnosis:   Enchondroma of middle phalanx, right long finger.  Post-Op Diagnosis:   Same  Procedure:   Excision and grafting of enchondroma of middle phalanx, right long finger.  Surgeon:   Pascal Lux, MD  Assistant:   None  Anesthesia:   General LMA  Findings:   As above.  Complications:   None  Fluids:   750 cc crystalloid  EBL:   0 cc  TT:   42 minutes at 250 mmHg  Drains:   None  Closure:   4-0 Prolene interrupted sutures  Brief Clinical Note:   The patient is a 47 year old female who struck her right long finger several weeks ago when she stumbled while descending a flight of stairs.  She went to the emergency room where x-rays of the right hand were obtained and demonstrated an enchondroma of the right long middle phalanx.  She presents at this time for excision and grafting of the enchondroma of the middle phalanx of her right long finger.  Procedure:   The patient was brought into the operating room and lain in the supine position.  After adequate general laryngeal mask anesthesia was obtained, the patient's right hand and upper extremity were prepped with ChloraPrep solution before being draped sterilely.  Preoperative antibiotics were administered.  A timeout was performed to verify the appropriate surgical site before the limb was exsanguinated with an Esmarch and the tourniquet inflated to 250 mmHg.  An approximately 2.5 cm incision was made longitudinally over the dorsal aspect of the middle phalanx of the right long finger.  The incision was carried down through the subcutaneous tissues with care taken to avoid damage to the superficial nerves and longitudinal veins.  Several transverse veins were cauterized using bipolar electrocautery.  The extensor mechanism was identified and split longitudinally in the midline to access the middle phalanx.  Numerous small perforations were made in the dorsal cortex to  create an ellipsoid shaped window.  The cortical piece was flipped out of the way on its periosteal hinge, permitting access to the central portion of the middle phalanx.  The clear whitish gelatinous material was removed using a small curette.  Care was taken to debride this area as thoroughly as possible.    The central portion of the middle phalanx was irrigated thoroughly with sterile saline solution before it was packed with a mixture of demineralized bone matrix and cancellus bone chips.  The cortical window was then flipped back over the opening into its anatomic position.  The extensor mechanism was reapproximated using 4-0 Vicryl interrupted sutures before the skin was closed using 4-0 Prolene interrupted sutures.  A total of 10 cc of 0.5% plain Sensorcaine was injected in and around the base of the right long finger to create a digital block to help with postoperative analgesia.  A sterile bulky dressing was then applied to the wound, incorporating a volarly placed AlumaFoam splint to support the finger.  The patient was then awakened, extubated, and returned to the recovery room in satisfactory condition after tolerating the procedure well.

## 2017-12-04 NOTE — Transfer of Care (Signed)
Immediate Anesthesia Transfer of Care Note  Patient: Anna [name]  Procedure(s) Performed: DEBRIDEMENT WITH BONE GRAFTING OF A PRESUMED ENCHONDROMA OF THE RIGHT LING MIDDLE PHALANX (Right Finger)  Patient Location: PACU  Anesthesia Type: General  Level of Consciousness: awake, alert  and patient cooperative  Airway and Oxygen Therapy: Patient Spontanous Breathing and Patient connected to supplemental oxygen  Post-op Assessment: Post-op Vital signs reviewed, Patient's Cardiovascular Status Stable, Respiratory Function Stable, Patent Airway and No signs of Nausea or vomiting  Post-op Vital Signs: Reviewed and stable  Complications: No apparent anesthesia complications

## 2017-12-05 ENCOUNTER — Encounter: Payer: Self-pay | Admitting: Surgery

## 2017-12-07 LAB — SURGICAL PATHOLOGY

## 2018-01-06 ENCOUNTER — Encounter: Payer: Managed Care, Other (non HMO) | Admitting: Physician Assistant

## 2018-04-01 ENCOUNTER — Other Ambulatory Visit: Payer: Self-pay | Admitting: Physician Assistant

## 2018-04-01 DIAGNOSIS — I10 Essential (primary) hypertension: Secondary | ICD-10-CM

## 2018-09-18 ENCOUNTER — Other Ambulatory Visit: Payer: Self-pay | Admitting: Physician Assistant

## 2018-09-18 DIAGNOSIS — I10 Essential (primary) hypertension: Secondary | ICD-10-CM

## 2018-09-22 NOTE — Telephone Encounter (Signed)
Please advise refill? 

## 2018-11-03 ENCOUNTER — Telehealth: Payer: Self-pay | Admitting: Physician Assistant

## 2018-11-03 NOTE — Telephone Encounter (Signed)
Per Tawanna Sat patient needs appointment for a physical. Last office visit was last year.

## 2018-11-03 NOTE — Telephone Encounter (Signed)
Pt needing a referral to [facility] to have her mammogram done.  Please call pt back to let her know this was done.  Thanks, [employer]

## 2018-11-03 NOTE — Telephone Encounter (Signed)
Patient scheduled for this coming Wednesday 08/19

## 2018-11-04 NOTE — Progress Notes (Signed)
Patient: Anna [name], Female    DOB: 1970/07/11, 48 y.o.   MRN: [id number] Visit Date: 11/05/2018  Today's Provider: Mar Daring, PA-C   Chief Complaint  Patient presents with  . Annual Exam   Subjective:     Annual physical exam Anna [name] is a 48 y.o. female who presents today for health maintenance and complete physical. She feels well. She reports exercising none. She reports she is sleeping well. -----------------------------------------------------------------   Review of Systems  Constitutional: Positive for fatigue.  HENT: Negative.   Eyes: Negative.   Respiratory: Negative.   Cardiovascular: Negative.   Gastrointestinal: Positive for abdominal distention.  Endocrine: Negative.   Genitourinary: Negative.   Musculoskeletal: Positive for myalgias.  Skin: Negative.   Allergic/Immunologic: Negative.   Neurological: Negative.   Hematological: Negative.   Psychiatric/Behavioral: Negative.     Social History      She  reports that she has never smoked. She has never used smokeless tobacco. She reports current alcohol use. She reports that she does not use drugs.       Social History   Socioeconomic History  . Marital status: Single    Spouse name: Not on file  . Number of children: Not on file  . Years of education: Not on file  . Highest education level: Not on file  Occupational History    Employer: [employer]  . Financial resource strain: Not on file  . Food insecurity    Worry: Not on file    Inability: Not on file  . Transportation needs    Medical: Not on file    Non-medical: Not on file  Tobacco Use  . Smoking status: Never Smoker  . Smokeless tobacco: Never Used  Substance and Sexual Activity  . Alcohol use: Yes    Alcohol/week: 0.0 standard drinks    Comment: OCCASIONALLY (3-4x/yr)  . Drug use: No  . Sexual activity: Not on file  Lifestyle  . Physical activity    Days per week: Not on file    Minutes per  session: Not on file  . Stress: Not on file  Relationships  . Social Herbalist on phone: Not on file    Gets together: Not on file    Attends religious service: Not on file    Active member of club or organization: Not on file    Attends meetings of clubs or organizations: Not on file    Relationship status: Not on file  Other Topics Concern  . Not on file  Social History Narrative  . Not on file    Past Medical History:  Diagnosis Date  . Hypertension      Patient Active Problem List   Diagnosis Date Noted  . Appendicitis 09/22/2017  . Family history of breast cancer in first degree relative 12/07/2015  . Abnormal menses 01/12/2015  . Family history of ovarian cancer 01/12/2015  . BMI 35.0-35.9,adult 09/24/2014  . Adult-onset obesity 09/14/2014  . Absence of menstruation 09/14/2014  . Anxiety 09/14/2014  . Clinical depression 09/14/2014  . Elevated blood sugar 09/14/2014  . Esophagitis, reflux 09/14/2014  . Family history of colon cancer 09/14/2014  . Helicobacter pylori infection 09/14/2014  . Cephalalgia 09/14/2014  . Cold sore 09/14/2014  . Large breasts 09/14/2014  . Night sweat 09/14/2014  . Awareness of heartbeats 09/14/2014  . Borderline diabetes 09/14/2014  . Avitaminosis D 07/01/2008  . Hypercholesterolemia without hypertriglyceridemia 04/21/2003  .  Benign essential HTN 04/16/2003    Past Surgical History:  Procedure Laterality Date  . FINGER ARTHROSCOPY WITH EXTENSIVE DEBRIDEMENT Right 12/04/2017   Procedure: DEBRIDEMENT WITH BONE GRAFTING OF A PRESUMED ENCHONDROMA OF THE RIGHT LING MIDDLE PHALANX;  Surgeon: Corky Mull, MD;  Location: [place];  Service: Orthopedics;  Laterality: Right;  CANCELLOUS CHIPS AND DEMINERALIZED BONE  MATRIX PUTTY AVAILABLE FOR GRAFTING VERY SMALL OSTEOTOMES AND CURETTES  . LAPAROSCOPIC APPENDECTOMY N/A 09/22/2017   Procedure: APPENDECTOMY LAPAROSCOPIC;  Surgeon: Vickie Epley, MD;  Location: [facility]  [facility];  Service: General;  Laterality: N/A;    Family History        Family Status  Relation Name Status  . Mother  Alive  . Father  Alive  . Sister 1 Alive  . [employer]  . MGM  Deceased  . MGF  Deceased  . PGM  Deceased  . PGF  Deceased  . Sister 2 Alive        Her family history includes Breast cancer in her maternal aunt; Breast cancer (age of onset: 30) in her mother; COPD in her father; Colon cancer in her father; Fibroids in her sister; Heart attack in her mother; Heart disease in her father; Hypertension in her father, mother, and sister; Obesity in her sister and sister; Ovarian cancer (age of onset: 31) in her sister; Stroke in her mother.      No Known Allergies   Current Outpatient Medications:  .  metoprolol succinate (TOPROL-XL) 50 MG 24 hr tablet, TAKE 1 TABLET BY MOUTH  DAILY, Disp: 90 tablet, Rfl: 1   Patient Care Team: Rubye Beach as PCP - General (Physician Assistant)    Objective:    Vitals: BP (!) 152/90 (BP Location: Left Arm, Patient Position: Sitting, Cuff Size: Large)   Pulse 67   Temp (!) 97.1 F (36.2 C) (Other (Comment)) Comment (Src): foreclosure  Resp 16   Ht 5\' 2"  (1.575 m)   Wt 212 lb (96.2 kg)   SpO2 98%   BMI 38.78 kg/m    Vitals:   11/05/18 1011  BP: (!) 152/90  Pulse: 67  Resp: 16  Temp: (!) 97.1 F (36.2 C)  TempSrc: Other (Comment)  SpO2: 98%  Weight: 212 lb (96.2 kg)  Height: 5\' 2"  (1.575 m)     Physical Exam Vitals signs reviewed.  Constitutional:      General: She is not in acute distress.    Appearance: Normal appearance. She is well-developed. She is obese. She is not ill-appearing or diaphoretic.  HENT:     Head: Normocephalic and atraumatic.     Right Ear: Hearing, tympanic membrane, ear canal and external ear normal.     Left Ear: Hearing, tympanic membrane, ear canal and external ear normal.     Nose: Nose normal.     Mouth/Throat:     Mouth: Mucous membranes are moist.     Pharynx:  Oropharynx is clear. Uvula midline. No oropharyngeal exudate.  Eyes:     General: No scleral icterus.       Right eye: No discharge.        Left eye: No discharge.     Extraocular Movements: Extraocular movements intact.     Conjunctiva/sclera: Conjunctivae normal.     Pupils: Pupils are equal, round, and reactive to light.  Neck:     Musculoskeletal: Normal range of motion and neck supple.     Thyroid: No thyromegaly.  Vascular: No JVD.     Trachea: No tracheal deviation.  Cardiovascular:     Rate and Rhythm: Normal rate and regular rhythm.     Pulses: Normal pulses.     Heart sounds: Normal heart sounds. No murmur. No friction rub. No gallop.   Pulmonary:     Effort: Pulmonary effort is normal. No respiratory distress.     Breath sounds: Normal breath sounds. No wheezing or rales.  Chest:     Chest wall: No tenderness.     Breasts: Breasts are symmetrical.        Right: No inverted nipple, mass, nipple discharge, skin change or tenderness.        Left: No inverted nipple, mass, nipple discharge, skin change or tenderness.  Abdominal:     General: Bowel sounds are normal. There is no distension.     Palpations: Abdomen is soft. There is no mass.     Tenderness: There is no abdominal tenderness. There is no guarding or rebound.     Hernia: There is no hernia in the left inguinal area.  Genitourinary:    General: Normal vulva.     Exam position: Supine.     Labia:        Right: No rash, tenderness, lesion or injury.        Left: No rash, tenderness, lesion or injury.      Vagina: Normal. No signs of injury. No vaginal discharge, erythema, tenderness or bleeding.     Cervix: No cervical motion tenderness, discharge or friability.     Adnexa:        Right: No mass, tenderness or fullness.         Left: No mass, tenderness or fullness.       Rectum: Normal.  Musculoskeletal: Normal range of motion.        General: No tenderness.     Right lower leg: No edema.     Left  lower leg: No edema.  Lymphadenopathy:     Cervical: No cervical adenopathy.  Skin:    General: Skin is warm and dry.     Capillary Refill: Capillary refill takes less than 2 seconds.     Findings: No rash.  Neurological:     General: No focal deficit present.     Mental Status: She is alert and oriented to person, place, and time. Mental status is at baseline.     Cranial Nerves: No cranial nerve deficit.     Motor: No weakness.     Coordination: Coordination normal.     Gait: Gait normal.     Deep Tendon Reflexes: Reflexes are normal and symmetric.  Psychiatric:        Mood and Affect: Mood normal.        Behavior: Behavior normal.        Thought Content: Thought content normal.        Judgment: Judgment normal.      Depression Screen PHQ 2/9 Scores 11/05/2018  PHQ - 2 Score 2  PHQ- 9 Score 7       Assessment & Plan:     Routine Health Maintenance and Physical Exam  Exercise Activities and Dietary recommendations Goals   None     Immunization History  Administered Date(s) Administered  . Influenza,inj,Quad PF,6+ Mos 01/12/2014  . Tdap 06/25/2006, 06/29/2008    Health Maintenance  Topic Date Due  . HIV Screening  07/08/1987  . PAP SMEAR-Modifier  09/23/2017  . TETANUS/TDAP  06/30/2018  .  MAMMOGRAM  07/09/2018  . INFLUENZA VACCINE  10/18/2018     Discussed health benefits of physical activity, and encouraged her to engage in regular exercise appropriate for her age and condition.    1. Annual physical exam Normal physical exam today. Will check labs as below and f/u pending lab results. If labs are stable and WNL she will not need to have these rechecked for one year at her next annual physical exam. She is to call the office in the meantime if she has any acute issue, questions or concerns. - CBC with Differential/Platelet - Comprehensive metabolic panel - Hemoglobin A1c - Lipid panel - TSH  2. Breast cancer screening Breast exam today was normal.  There is family history of breast cancer. She does perform regular self breast exams. Mammogram was ordered as below. Information for [facility] was given to patient so she may schedule her mammogram at her convenience. - MM 3D SCREEN BREAST BILATERAL; Future  3. Family history of breast cancer in first degree relative Mother age 47. - MM 3D SCREEN BREAST BILATERAL; Future  4. Cervical cancer screening Pap collected today. Will send as below and f/u pending results. - Pap IG and HPV (high risk) DNA detection  5. Class 2 severe obesity due to excess calories with serious comorbidity and body mass index (BMI) of 38.0 to 38.9 in adult [facility]) Counseled patient on healthy lifestyle modifications including dieting and exercise.  - CBC with Differential/Platelet - Comprehensive metabolic panel - Hemoglobin A1c - Lipid panel  6. Benign essential HTN Stable. Continue Metoprolol 50mg  xr. Will check labs as below and f/u pending results. - CBC with Differential/Platelet - Comprehensive metabolic panel - Hemoglobin A1c - Lipid panel - TSH  7. Avitaminosis D Stable.   8. Borderline diabetes Diet controlled. Will check labs as below and f/u pending results. - CBC with Differential/Platelet - Comprehensive metabolic panel - Hemoglobin A1c - Lipid panel  9. Hypercholesterolemia without hypertriglyceridemia Diet controlled. Patient declines statins at this time. Will check labs as below and f/u pending results. - CBC with Differential/Platelet - Comprehensive metabolic panel - Hemoglobin A1c - Lipid panel  10. Screening for HIV without presence of risk factors Will check labs as below and f/u pending results. - HIV Antibody (routine testing w rflx)  11. Influenza vaccination declined Declined flu vaccine today.  12. Tetanus, diphtheria, and acellular pertussis (Tdap) vaccination declined Declined Td/Tdap today. Last done in 2010.   --------------------------------------------------------------------    Mar Daring, PA-C  [facility]

## 2018-11-05 ENCOUNTER — Encounter: Payer: Self-pay | Admitting: Physician Assistant

## 2018-11-05 ENCOUNTER — Other Ambulatory Visit: Payer: Self-pay

## 2018-11-05 ENCOUNTER — Ambulatory Visit (INDEPENDENT_AMBULATORY_CARE_PROVIDER_SITE_OTHER): Payer: Managed Care, Other (non HMO) | Admitting: Physician Assistant

## 2018-11-05 VITALS — BP 152/90 | HR 67 | Temp 97.1°F | Resp 16 | Ht 62.0 in | Wt 212.0 lb

## 2018-11-05 DIAGNOSIS — Z114 Encounter for screening for human immunodeficiency virus [HIV]: Secondary | ICD-10-CM

## 2018-11-05 DIAGNOSIS — Z2821 Immunization not carried out because of patient refusal: Secondary | ICD-10-CM

## 2018-11-05 DIAGNOSIS — Z6838 Body mass index (BMI) 38.0-38.9, adult: Secondary | ICD-10-CM

## 2018-11-05 DIAGNOSIS — Z0000 Encounter for general adult medical examination without abnormal findings: Secondary | ICD-10-CM | POA: Diagnosis not present

## 2018-11-05 DIAGNOSIS — E7800 Pure hypercholesterolemia, unspecified: Secondary | ICD-10-CM

## 2018-11-05 DIAGNOSIS — Z803 Family history of malignant neoplasm of breast: Secondary | ICD-10-CM | POA: Diagnosis not present

## 2018-11-05 DIAGNOSIS — Z1239 Encounter for other screening for malignant neoplasm of breast: Secondary | ICD-10-CM | POA: Diagnosis not present

## 2018-11-05 DIAGNOSIS — Z124 Encounter for screening for malignant neoplasm of cervix: Secondary | ICD-10-CM

## 2018-11-05 DIAGNOSIS — R7303 Prediabetes: Secondary | ICD-10-CM

## 2018-11-05 DIAGNOSIS — I10 Essential (primary) hypertension: Secondary | ICD-10-CM

## 2018-11-05 DIAGNOSIS — E559 Vitamin D deficiency, unspecified: Secondary | ICD-10-CM

## 2018-11-05 NOTE — Patient Instructions (Signed)
Health Maintenance, Female Adopting a healthy lifestyle and getting preventive care are important in promoting health and wellness. Ask your health care provider about:  The right schedule for you to have regular tests and exams.  Things you can do on your own to prevent diseases and keep yourself healthy. What should I know about diet, weight, and exercise? Eat a healthy diet   Eat a diet that includes plenty of vegetables, fruits, low-fat dairy products, and lean protein.  Do not eat a lot of foods that are high in solid fats, added sugars, or sodium. Maintain a healthy weight Body mass index (BMI) is used to identify weight problems. It estimates body fat based on height and weight. Your health care provider can help determine your BMI and help you achieve or maintain a healthy weight. Get regular exercise Get regular exercise. This is one of the most important things you can do for your health. Most adults should:  Exercise for at least 150 minutes each week. The exercise should increase your heart rate and make you sweat (moderate-intensity exercise).  Do strengthening exercises at least twice a week. This is in addition to the moderate-intensity exercise.  Spend less time sitting. Even light physical activity can be beneficial. Watch cholesterol and blood lipids Have your blood tested for lipids and cholesterol at 46 years of age, then have this test every 5 years. Have your cholesterol levels checked more often if:  Your lipid or cholesterol levels are high.  You are older than 46 years of age.  You are at high risk for heart disease. What should I know about cancer screening? Depending on your health history and family history, you may need to have cancer screening at various ages. This may include screening for:  Breast cancer.  Cervical cancer.  Colorectal cancer.  Skin cancer.  Lung cancer. What should I know about heart disease, diabetes, and high blood  pressure? Blood pressure and heart disease  High blood pressure causes heart disease and increases the risk of stroke. This is more likely to develop in people who have high blood pressure readings, are of African descent, or are overweight.  Have your blood pressure checked: ? Every 3-5 years if you are 18-39 years of age. ? Every year if you are 40 years old or older. Diabetes Have regular diabetes screenings. This checks your fasting blood sugar level. Have the screening done:  Once every three years after age 40 if you are at a normal weight and have a low risk for diabetes.  More often and at a younger age if you are overweight or have a high risk for diabetes. What should I know about preventing infection? Hepatitis B If you have a higher risk for hepatitis B, you should be screened for this virus. Talk with your health care provider to find out if you are at risk for hepatitis B infection. Hepatitis C Testing is recommended for:  Everyone born from 1945 through 1965.  Anyone with known risk factors for hepatitis C. Sexually transmitted infections (STIs)  Get screened for STIs, including gonorrhea and chlamydia, if: ? You are sexually active and are younger than 46 years of age. ? You are older than 46 years of age and your health care provider tells you that you are at risk for this type of infection. ? Your sexual activity has changed since you were last screened, and you are at increased risk for chlamydia or gonorrhea. Ask your health care provider if   you are at risk.  Ask your health care provider about whether you are at high risk for HIV. Your health care provider may recommend a prescription medicine to help prevent HIV infection. If you choose to take medicine to prevent HIV, you should first get tested for HIV. You should then be tested every 3 months for as long as you are taking the medicine. Pregnancy  If you are about to stop having your period (premenopausal) and  you may become pregnant, seek counseling before you get pregnant.  Take 400 to 800 micrograms (mcg) of folic acid every day if you become pregnant.  Ask for birth control (contraception) if you want to prevent pregnancy. Osteoporosis and menopause Osteoporosis is a disease in which the bones lose minerals and strength with aging. This can result in bone fractures. If you are 65 years old or older, or if you are at risk for osteoporosis and fractures, ask your health care provider if you should:  Be screened for bone loss.  Take a calcium or vitamin D supplement to lower your risk of fractures.  Be given hormone replacement therapy (HRT) to treat symptoms of menopause. Follow these instructions at home: Lifestyle  Do not use any products that contain nicotine or tobacco, such as cigarettes, e-cigarettes, and chewing tobacco. If you need help quitting, ask your health care provider.  Do not use street drugs.  Do not share needles.  Ask your health care provider for help if you need support or information about quitting drugs. Alcohol use  Do not drink alcohol if: ? Your health care provider tells you not to drink. ? You are pregnant, may be pregnant, or are planning to become pregnant.  If you drink alcohol: ? Limit how much you use to 0-1 drink a day. ? Limit intake if you are breastfeeding.  Be aware of how much alcohol is in your drink. In the [place] one drink equals one 12 oz bottle of beer (355 mL), one 5 oz glass of wine (148 mL), or one 1 oz glass of hard liquor (44 mL). General instructions  Schedule regular health, dental, and eye exams.  Stay current with your vaccines.  Tell your health care provider if: ? You often feel depressed. ? You have ever been abused or do not feel safe at home. Summary  Adopting a healthy lifestyle and getting preventive care are important in promoting health and wellness.  Follow your health care provider's instructions about healthy  diet, exercising, and getting tested or screened for diseases.  Follow your health care provider's instructions on monitoring your cholesterol and blood pressure. This information is not intended to replace advice given to you by your health care provider. Make sure you discuss any questions you have with your health care provider. Document Released: 09/18/2010 Document Revised: 02/26/2018 Document Reviewed: 02/26/2018 Elsevier Patient Education  2020 [employer]vier Inc.  

## 2018-11-06 ENCOUNTER — Telehealth: Payer: Self-pay

## 2018-11-06 LAB — LIPID PANEL
Chol/HDL Ratio: 5.7 ratio — ABNORMAL HIGH (ref 0.0–4.4)
Cholesterol, Total: 217 mg/dL — ABNORMAL HIGH (ref 100–199)
HDL: 38 mg/dL — ABNORMAL LOW (ref >39)
LDL Calculated: 154 mg/dL — ABNORMAL HIGH (ref 0–99)
Triglycerides: 127 mg/dL (ref 0–149)
VLDL Cholesterol Cal: 25 mg/dL (ref 5–40)

## 2018-11-06 LAB — COMPREHENSIVE METABOLIC PANEL
ALT: 16 IU/L (ref 0–32)
AST: 18 IU/L (ref 0–40)
Albumin/Globulin Ratio: 1.3 (ref 1.2–2.2)
Albumin: 3.9 g/dL (ref 3.8–4.8)
Alkaline Phosphatase: 110 IU/L (ref 39–117)
BUN/Creatinine Ratio: 15 (ref 9–23)
BUN: 11 mg/dL (ref 6–24)
Bilirubin Total: 0.3 mg/dL (ref 0.0–1.2)
CO2: 24 mmol/L (ref 20–29)
Calcium: 9.3 mg/dL (ref 8.7–10.2)
Chloride: 103 mmol/L (ref 96–106)
Creatinine, Ser: 0.75 mg/dL (ref 0.57–1.00)
GFR calc Af Amer: 111 mL/min/{1.73_m2} (ref >59)
GFR calc non Af Amer: 96 mL/min/{1.73_m2} (ref >59)
Globulin, Total: 3.1 g/dL (ref 1.5–4.5)
Glucose: 91 mg/dL (ref 65–99)
Potassium: 4.1 mmol/L (ref 3.5–5.2)
Sodium: 141 mmol/L (ref 134–144)
Total Protein: 7 g/dL (ref 6.0–8.5)

## 2018-11-06 LAB — CBC WITH DIFFERENTIAL/PLATELET
Basophils Absolute: 0.1 10*3/uL (ref 0.0–0.2)
Basos: 1 %
EOS (ABSOLUTE): 0.2 10*3/uL (ref 0.0–0.4)
Eos: 3 %
Hematocrit: 37.3 % (ref 34.0–46.6)
Hemoglobin: 12.8 g/dL (ref 11.1–15.9)
Immature Grans (Abs): 0 10*3/uL (ref 0.0–0.1)
Immature Granulocytes: 0 %
Lymphocytes Absolute: 2.7 10*3/uL (ref 0.7–3.1)
Lymphs: 44 %
MCH: 29 pg (ref 26.6–33.0)
MCHC: 34.3 g/dL (ref 31.5–35.7)
MCV: 84 fL (ref 79–97)
Monocytes Absolute: 0.4 10*3/uL (ref 0.1–0.9)
Monocytes: 7 %
Neutrophils Absolute: 2.7 10*3/uL (ref 1.4–7.0)
Neutrophils: 45 %
Platelets: 284 10*3/uL (ref 150–450)
RBC: 4.42 x10E6/uL (ref 3.77–5.28)
RDW: 12.2 % (ref 11.7–15.4)
WBC: 6 10*3/uL (ref 3.4–10.8)

## 2018-11-06 LAB — HEMOGLOBIN A1C
Est. average glucose Bld gHb Est-mCnc: 126 mg/dL
Hgb A1c MFr Bld: 6 % — ABNORMAL HIGH (ref 4.8–5.6)

## 2018-11-06 LAB — HIV ANTIBODY (ROUTINE TESTING W REFLEX): HIV Screen 4th Generation wRfx: NONREACTIVE

## 2018-11-06 LAB — TSH: TSH: 1.92 u[IU]/mL (ref 0.450–4.500)

## 2018-11-06 NOTE — Telephone Encounter (Signed)
-----   Message from Mar Daring, [place] sent at 11/06/2018  9:01 AM EDT ----- All labs are within normal limits and stable.  Thanks! -JB

## 2018-11-06 NOTE — Telephone Encounter (Signed)
Order changed.

## 2018-11-06 NOTE — Telephone Encounter (Signed)
Patient advised.

## 2018-11-06 NOTE — Telephone Encounter (Signed)
LMTCB

## 2018-11-06 NOTE — Addendum Note (Signed)
Addended by: Mar Daring on: 11/06/2018 03:15 PM   Modules accepted: Orders

## 2018-11-06 NOTE — Telephone Encounter (Signed)
Patient says she tried to call [facility] to schedule her mammo and they told her that she needed to call our office for a different order to be placed. Patient says she told the scheduler that she was experiencing pain in the left breast and this now requires a diagnostic mammo. Please change order and call patient back with appointment. Patient says she didn't mention this breast pain to Avis during her CPE.  Patient denies any lump, mass or nipple discharge.

## 2018-11-07 ENCOUNTER — Telehealth: Payer: Self-pay | Admitting: Physician Assistant

## 2018-11-07 NOTE — Telephone Encounter (Signed)
In order to order breast ultrasound [place] needs to know clock position of left breast pain. If entire breast per their office order needs to be changed to screening,Thanks

## 2018-11-07 NOTE — Telephone Encounter (Signed)
added

## 2018-11-07 NOTE — Addendum Note (Signed)
Addended by: Mar Daring on: 11/07/2018 03:30 PM   Modules accepted: Orders

## 2018-11-07 NOTE — Telephone Encounter (Signed)
Per patient, pain is between 11:00 and 1:00 o'clock.

## 2018-11-09 LAB — PAP IG AND HPV HIGH-RISK
HPV, high-risk: NEGATIVE
PAP Smear Comment: 0

## 2018-11-10 ENCOUNTER — Telehealth: Payer: Self-pay

## 2018-11-10 NOTE — Telephone Encounter (Signed)
Patient advised as directed below. 

## 2018-11-10 NOTE — Telephone Encounter (Signed)
-----   Message from Mar Daring, [place] sent at 11/10/2018 12:44 PM EDT ----- Pap is normal, HPV negative.  Will repeat in 3-5 years.

## 2018-11-18 ENCOUNTER — Ambulatory Visit
Admission: RE | Admit: 2018-11-18 | Discharge: 2018-11-18 | Disposition: A | Discharge status: Home / Self Care | Payer: Managed Care, Other (non HMO) | Source: Ambulatory Visit | Attending: Physician Assistant | Admitting: Physician Assistant

## 2018-11-18 ENCOUNTER — Telehealth: Payer: Self-pay

## 2018-11-18 DIAGNOSIS — Z803 Family history of malignant neoplasm of breast: Secondary | ICD-10-CM

## 2018-11-18 DIAGNOSIS — Z1239 Encounter for other screening for malignant neoplasm of breast: Secondary | ICD-10-CM | POA: Diagnosis present

## 2018-11-18 NOTE — Telephone Encounter (Signed)
-----   Message from Mar Daring, PA-C sent at 11/18/2018  2:21 PM EDT ----- Normal mammogram. Repeat screening in one year.

## 2018-11-18 NOTE — Telephone Encounter (Signed)
Patient advised as directed below. 

## 2018-12-18 ENCOUNTER — Other Ambulatory Visit: Payer: Self-pay

## 2018-12-18 DIAGNOSIS — I10 Essential (primary) hypertension: Secondary | ICD-10-CM

## 2018-12-18 MED ORDER — METOPROLOL SUCCINATE ER 50 MG PO TB24: 50.0000 mg | ORAL_TABLET | Freq: Every day | ORAL | 0 refills | Status: DC

## 2018-12-18 NOTE — Telephone Encounter (Signed)
Patient called requesting a refill. She misplaced her medication and needs a temporary supply sent to [facility] [address]. He mail order doesn't arrive until 12/26/2018.

## 2018-12-18 NOTE — Telephone Encounter (Signed)
Please advise 

## 2019-01-04 ENCOUNTER — Other Ambulatory Visit: Payer: Self-pay | Admitting: Physician Assistant

## 2019-01-04 DIAGNOSIS — I10 Essential (primary) hypertension: Secondary | ICD-10-CM

## 2019-01-08 ENCOUNTER — Telehealth: Payer: Self-pay | Admitting: Physician Assistant

## 2019-01-08 DIAGNOSIS — I10 Essential (primary) hypertension: Secondary | ICD-10-CM

## 2019-01-08 MED ORDER — METOPROLOL SUCCINATE ER 50 MG PO TB24: 50.0000 mg | ORAL_TABLET | Freq: Every day | ORAL | 0 refills | Status: DC

## 2019-01-08 NOTE — Telephone Encounter (Signed)
Sent in

## 2019-01-08 NOTE — Telephone Encounter (Signed)
Pt's mail order pharmacy has not sent her the metoprolol succinate (TOPROL-XL) 50 MG 24 hr tablet. Pt has been with out it for a few days now.  She is asking if Tawanna Sat could call in an emergency amount of 5 to:  [id number] [place], [place] - [address] [phone] (Phone) [phone] (Fax)   Please call pt back to let her know if this can be done today.   Thanks, [employer]

## 2019-01-08 NOTE — Telephone Encounter (Signed)
Please advise 

## 2019-01-08 NOTE — Telephone Encounter (Signed)
Patient advised as below.  

## 2019-07-10 ENCOUNTER — Other Ambulatory Visit: Payer: Self-pay | Admitting: Physician Assistant

## 2019-07-10 DIAGNOSIS — I10 Essential (primary) hypertension: Secondary | ICD-10-CM

## 2019-07-10 MED ORDER — METOPROLOL SUCCINATE ER 50 MG PO TB24: 50.0000 mg | ORAL_TABLET | Freq: Every day | ORAL | 0 refills | Status: DC

## 2019-07-10 NOTE — Telephone Encounter (Signed)
Medication Refill - Medication:  metoprolol succinate (TOPROL-XL) 50 MG 24 hr tablet   Has the patient contacted their pharmacy?  Yes advised to call office. Pt is on vacation and forgot medication so she is requesting 3-4 pills to hold her over. Please advise.   Preferred Pharmacy (with phone number or street name):  [facility]  [place], [place], [address] Phone: [phone]  Agent: Please be advised that RX refills may take up to 3 business days. We ask that you follow-up with your pharmacy.

## 2019-07-10 NOTE — Telephone Encounter (Signed)
Call to patient- scheduled appointment for Monday- she is out of town and forgot BP medication requesting 3 pills. Courtesy pills sent to pharmacy

## 2019-07-13 ENCOUNTER — Ambulatory Visit: Payer: Self-pay | Admitting: Physician Assistant

## 2019-07-14 NOTE — Progress Notes (Deleted)
     Established patient visit   Patient: Anna [name]   DOB: 01-31-1971   49 y.o. Female  MRN: [id number] Visit Date: 07/14/2019  Today's healthcare provider: Mar Daring, PA-C   No chief complaint on file.  Subjective    HPI Hypertension, follow-up  BP Readings from Last 3 Encounters:  11/05/18 (!) 152/90  12/04/17 114/74  09/22/17 118/69   Wt Readings from Last 3 Encounters:  11/05/18 212 lb (96.2 kg)  12/04/17 207 lb (93.9 kg)  09/22/17 213 lb 4.8 oz (96.8 kg)     She was last seen for hypertension 8 months ago.  BP at that visit was ***. Management since that visit includes ***.  She reports {excellent/good/fair/poor:19665} compliance with treatment. She {is/is not:9024} having side effects. {document side effects if present:1} She is following a {diet:21022986} diet. She {is/is not:9024} exercising. She {does/does not:200015} smoke.  Use of agents associated with hypertension: {bp agents assoc with hypertension:511::"none"}.   Outside blood pressures are {***enter patient reported home BP readings, or 'not being checked':1}.  Symptoms:  YES NO    []    []    Chest Pain   []    []    Chest pressure/discomfort   []    []    Palpitations   []    []    Dyspnea   []    []    Orthopnea   []    []    Paroxysmal nocturnal dyspnea   []    []    Lower extremity edema   []    []   Syncope   Pertinent labs: Lab Results  Component Value Date   CHOL 217 (H) 11/05/2018   HDL 38 (L) 11/05/2018   LDLCALC 154 (H) 11/05/2018   TRIG 127 11/05/2018   CHOLHDL 5.7 (H) 11/05/2018   Lab Results  Component Value Date   NA 141 11/05/2018   K 4.1 11/05/2018   CO2 24 11/05/2018   GLUCOSE 91 11/05/2018   BUN 11 11/05/2018   CREATININE 0.75 11/05/2018   CALCIUM 9.3 11/05/2018   GFRNONAA 96 11/05/2018   GFRAA 111 11/05/2018     The 10-year ASCVD risk score Mikey Bussing DC Jr., et al., 2013) is: 11.4%    ---------------------------------------------------------------------------------------------------  {Show patient history (optional):23778::" "}   Medications: Outpatient Medications Prior to Visit  Medication Sig  . metoprolol succinate (TOPROL-XL) 50 MG 24 hr tablet Take 1 tablet (50 mg total) by mouth daily. Take with or immediately following a meal.   No facility-administered medications prior to visit.    Review of Systems  {Show previous labs (optional):23779::" "}   Objective    There were no vitals taken for this visit. {Show previous vital signs (optional):23777::" "}  Physical Exam  ***  No results found for any visits on 07/16/19.  Assessment & Plan    ***  No follow-ups on file.      {provider attestation***:1}   Rubye Beach  [facility] [phone] (phone) [phone] (fax)  [place]

## 2019-07-16 ENCOUNTER — Ambulatory Visit: Payer: Self-pay | Admitting: Physician Assistant

## 2019-12-31 ENCOUNTER — Other Ambulatory Visit: Payer: Self-pay | Admitting: Physician Assistant

## 2019-12-31 DIAGNOSIS — I10 Essential (primary) hypertension: Secondary | ICD-10-CM

## 2020-01-05 ENCOUNTER — Other Ambulatory Visit: Payer: Self-pay | Admitting: Physician Assistant

## 2020-01-05 DIAGNOSIS — I10 Essential (primary) hypertension: Secondary | ICD-10-CM

## 2020-01-16 ENCOUNTER — Other Ambulatory Visit: Payer: Self-pay | Admitting: Physician Assistant

## 2020-01-16 DIAGNOSIS — I10 Essential (primary) hypertension: Secondary | ICD-10-CM

## 2020-01-17 NOTE — Telephone Encounter (Signed)
Called pt and LM on VM to call and make appt for refills- 2 courtesy refills already provided. Refusing med request.

## 2020-02-19 ENCOUNTER — Ambulatory Visit (INDEPENDENT_AMBULATORY_CARE_PROVIDER_SITE_OTHER): Payer: Managed Care, Other (non HMO) | Admitting: Physician Assistant

## 2020-02-19 ENCOUNTER — Other Ambulatory Visit: Payer: Self-pay

## 2020-02-19 ENCOUNTER — Encounter: Payer: Self-pay | Admitting: Physician Assistant

## 2020-02-19 VITALS — BP 141/91 | HR 65 | Temp 98.4°F | Resp 16 | Ht 62.0 in | Wt 212.5 lb

## 2020-02-19 DIAGNOSIS — E7800 Pure hypercholesterolemia, unspecified: Secondary | ICD-10-CM

## 2020-02-19 DIAGNOSIS — R7303 Prediabetes: Secondary | ICD-10-CM | POA: Diagnosis not present

## 2020-02-19 DIAGNOSIS — E559 Vitamin D deficiency, unspecified: Secondary | ICD-10-CM | POA: Diagnosis not present

## 2020-02-19 DIAGNOSIS — Z6838 Body mass index (BMI) 38.0-38.9, adult: Secondary | ICD-10-CM | POA: Insufficient documentation

## 2020-02-19 NOTE — Patient Instructions (Signed)
Phentermine Contrave (wellbutrin/naltrexone) Kirke Shaggy (daily injection) Wegovy (weekly injection)   Exercising to Lose Weight Exercise is structured, repetitive physical activity to improve fitness and health. Getting regular exercise is important for everyone. It is especially important if you are overweight. Being overweight increases your risk of heart disease, stroke, diabetes, high blood pressure, and several types of cancer. Reducing your calorie intake and exercising can help you lose weight. Exercise is usually categorized as moderate or vigorous intensity. To lose weight, most people need to do a certain amount of moderate-intensity or vigorous-intensity exercise each week. Moderate-intensity exercise  Moderate-intensity exercise is any activity that gets you moving enough to burn at least three times more energy (calories) than if you were sitting. Examples of moderate exercise include:  Walking a mile in 15 minutes.  Doing light yard work.  Biking at an easy pace. Most people should get at least 150 minutes (2 hours and 30 minutes) a week of moderate-intensity exercise to maintain their body weight. Vigorous-intensity exercise Vigorous-intensity exercise is any activity that gets you moving enough to burn at least six times more calories than if you were sitting. When you exercise at this intensity, you should be working hard enough that you are not able to carry on a conversation. Examples of vigorous exercise include:  Running.  Playing a team sport, such as football, basketball, and soccer.  Jumping rope. Most people should get at least 75 minutes (1 hour and 15 minutes) a week of vigorous-intensity exercise to maintain their body weight. How can exercise affect me? When you exercise enough to burn more calories than you eat, you lose weight. Exercise also reduces body fat and builds muscle. The more muscle you have, the more calories you burn. Exercise also:  Improves  mood.  Reduces stress and tension.  Improves your overall fitness, flexibility, and endurance.  Increases bone strength. The amount of exercise you need to lose weight depends on:  Your age.  The type of exercise.  Any health conditions you have.  Your overall physical ability. Talk to your health care provider about how much exercise you need and what types of activities are safe for you. What actions can I take to lose weight? Nutrition   Make changes to your diet as told by your health care provider or diet and nutrition specialist (dietitian). This may include: ? Eating fewer calories. ? Eating more protein. ? Eating less unhealthy fats. ? Eating a diet that includes fresh fruits and vegetables, whole grains, low-fat dairy products, and lean protein. ? Avoiding foods with added fat, salt, and sugar.  Drink plenty of water while you exercise to prevent dehydration or heat stroke. Activity  Choose an activity that you enjoy and set realistic goals. Your health care provider can help you make an exercise plan that works for you.  Exercise at a moderate or vigorous intensity most days of the week. ? The intensity of exercise may vary from person to person. You can tell how intense a workout is for you by paying attention to your breathing and heartbeat. Most people will notice their breathing and heartbeat get faster with more intense exercise.  Do resistance training twice each week, such as: ? Push-ups. ? Sit-ups. ? Lifting weights. ? Using resistance bands.  Getting short amounts of exercise can be just as helpful as long structured periods of exercise. If you have trouble finding time to exercise, try to include exercise in your daily routine. ? Get up, stretch, and walk  around every 30 minutes throughout the day. ? Go for a walk during your lunch break. ? Park your car farther away from your destination. ? If you take public transportation, get off one stop early  and walk the rest of the way. ? Make phone calls while standing up and walking around. ? Take the stairs instead of elevators or escalators.  Wear comfortable clothes and shoes with good support.  Do not exercise so much that you hurt yourself, feel dizzy, or get very short of breath. Where to find more information  U.S. Department of Health and Human Services: [employer].at  Centers for Disease Control and Prevention (CDC): http://www.wolf.info/ Contact a health care provider:  Before starting a new exercise program.  If you have questions or concerns about your weight.  If you have a medical problem that keeps you from exercising. Get help right away if you have any of the following while exercising:  Injury.  Dizziness.  Difficulty breathing or shortness of breath that does not go away when you stop exercising.  Chest pain.  Rapid heartbeat. Summary  Being overweight increases your risk of heart disease, stroke, diabetes, high blood pressure, and several types of cancer.  Losing weight happens when you burn more calories than you eat.  Reducing the amount of calories you eat in addition to getting regular moderate or vigorous exercise each week helps you lose weight. This information is not intended to replace advice given to you by your health care provider. Make sure you discuss any questions you have with your health care provider. Document Revised: 03/18/2017 Document Reviewed: 03/18/2017 Elsevier Patient Education  2020 [employer].

## 2020-02-19 NOTE — Progress Notes (Signed)
Established patient visit   Patient: Anna [name]   DOB: [birth year]   49 y.o. Female  MRN: [id number] Visit Date: 02/19/2020  Today's healthcare provider: Mar Daring, PA-C   Chief Complaint  Patient presents with  . Headache  . Obesity   Subjective    HPI  Obesity: patient here with c/o obesity. Reports that last months she cut off sugary drinks and was working out but then stopped. She currently not exercising.   Wt Readings from Last 3 Encounters:  02/19/20 212 lb 8 oz (96.4 kg)  11/05/18 212 lb (96.2 kg)  12/04/17 207 lb (93.9 kg)   Reports that she been feeling dizzy and having some headaches. Feels is a lot of stress. Dizziness started last week.They come around 3pm and by the time she gets off of work around 8 pm her head is hurting, then when she calms it eases off. Feels it is a stress response as she has been so stressed with work.  Depression screen [facility] 2/9 02/19/2020 11/05/2018  Decreased Interest 3 1  Down, Depressed, Hopeless 3 1  PHQ - 2 Score 6 2  Altered sleeping 3 0  Tired, decreased energy 3 2  Change in appetite 3 2  Feeling bad or failure about yourself  3 1  Trouble concentrating 1 0  Moving slowly or fidgety/restless 3 0  Suicidal thoughts 0 0  PHQ-9 Score 22 7  Difficult doing work/chores Very difficult Not difficult at all    Patient Active Problem List   Diagnosis Date Noted  . Class 2 severe obesity due to excess calories with serious comorbidity and body mass index (BMI) of 38.0 to 38.9 in adult [facility] 02/19/2020  . Appendicitis 09/22/2017  . Family history of breast cancer in first degree relative 12/07/2015  . Abnormal menses 01/12/2015  . Family history of ovarian cancer 01/12/2015  . Adult-onset obesity 09/14/2014  . Absence of menstruation 09/14/2014  . Anxiety 09/14/2014  . Clinical depression 09/14/2014  . Esophagitis, reflux 09/14/2014  . Family history of colon cancer 09/14/2014  . Helicobacter pylori infection  09/14/2014  . Cephalalgia 09/14/2014  . Cold sore 09/14/2014  . Large breasts 09/14/2014  . Night sweat 09/14/2014  . Awareness of heartbeats 09/14/2014  . Borderline diabetes 09/14/2014  . Avitaminosis D 07/01/2008  . Hypercholesterolemia without hypertriglyceridemia 04/21/2003  . Benign essential HTN 04/16/2003   Past Medical History:  Diagnosis Date  . Hypertension        Medications: Outpatient Medications Prior to Visit  Medication Sig  . metoprolol succinate (TOPROL-XL) 50 MG 24 hr tablet Take 1 tablet (50 mg total) by mouth daily. Take with or immediately following a meal.   No facility-administered medications prior to visit.    Review of Systems  Constitutional: Negative.   Respiratory: Negative.   Cardiovascular: Negative.   Neurological: Positive for dizziness.  Psychiatric/Behavioral: The patient is nervous/anxious.     Last CBC Lab Results  Component Value Date   WBC 6.0 11/05/2018   HGB 12.8 11/05/2018   HCT 37.3 11/05/2018   MCV 84 11/05/2018   MCH 29.0 11/05/2018   RDW 12.2 11/05/2018   PLT 284 65/05/5463   Last metabolic panel Lab Results  Component Value Date   GLUCOSE 91 11/05/2018   NA 141 11/05/2018   K 4.1 11/05/2018   CL 103 11/05/2018   CO2 24 11/05/2018   BUN 11 11/05/2018   CREATININE 0.75 11/05/2018   GFRNONAA 96 11/05/2018  GFRAA 111 11/05/2018   CALCIUM 9.3 11/05/2018   PROT 7.0 11/05/2018   ALBUMIN 3.9 11/05/2018   LABGLOB 3.1 11/05/2018   AGRATIO 1.3 11/05/2018   BILITOT 0.3 11/05/2018   ALKPHOS 110 11/05/2018   AST 18 11/05/2018   ALT 16 11/05/2018   ANIONGAP 9 09/21/2017      Objective    BP (!) 141/91 (BP Location: Right Arm, Patient Position: Sitting, Cuff Size: Large)   Pulse 65   Temp 98.4 F (36.9 C) (Oral)   Resp 16   Ht 5\' 2"  (1.575 m)   Wt 212 lb 8 oz (96.4 kg)   BMI 38.87 kg/m  BP Readings from Last 3 Encounters:  02/19/20 (!) 141/91  11/05/18 (!) 152/90  12/04/17 114/74   Wt Readings from  Last 3 Encounters:  02/19/20 212 lb 8 oz (96.4 kg)  11/05/18 212 lb (96.2 kg)  12/04/17 207 lb (93.9 kg)      Physical Exam Vitals reviewed.  Constitutional:      General: She is not in acute distress.    Appearance: She is well-developed. She is obese. She is not ill-appearing.  HENT:     Head: Normocephalic and atraumatic.  Pulmonary:     Effort: Pulmonary effort is normal. No respiratory distress.  Musculoskeletal:     Cervical back: Normal range of motion and neck supple.  Neurological:     Mental Status: She is alert.  Psychiatric:        Mood and Affect: Mood normal.        Behavior: Behavior normal.        Thought Content: Thought content normal.        Judgment: Judgment normal.      No results found for any visits on 02/19/20.  Assessment & Plan     1. Class 2 severe obesity due to excess calories with serious comorbidity and body mass index (BMI) of 38.0 to 38.9 in adult [facility]) Will check labs as below and f/u pending results. Counseled patient on healthy lifestyle modifications including dieting and exercise.  Referral to medical nutrition as below. Patient wants to try to lose weight without medication assistance. Discussed all weight loss medications. Gave list on AVS for patient to research.  - CBC w/Diff/Platelet - Comprehensive Metabolic Panel (CMET) - HgB A1c - TSH - Lipid Panel With LDL/HDL Ratio - Amb ref to Medical Nutrition Therapy-MNT  2. Hypercholesterolemia without hypertriglyceridemia Diet controlled. Will check labs as below and f/u pending results.  3. Borderline diabetes Diet controlled. Will check labs as below and f/u pending results.  4. Avitaminosis D H/O this. Will check labs as below and f/u pending results. - Vitamin D (25 hydroxy)   Return if symptoms worsen or fail to improve.      Reynolds Bowl, PA-C, have reviewed all documentation for this visit. The documentation on 02/23/20 for the exam, diagnosis, procedures,  and orders are all accurate and complete.   Rubye Beach  [facility] [phone] (phone) [phone] (fax)  [place]

## 2020-02-22 ENCOUNTER — Encounter: Payer: Self-pay | Admitting: Physician Assistant

## 2020-02-23 ENCOUNTER — Encounter: Payer: Self-pay | Admitting: Physician Assistant

## 2020-02-23 LAB — LIPID PANEL WITH LDL/HDL RATIO

## 2020-02-24 ENCOUNTER — Other Ambulatory Visit: Payer: Self-pay | Admitting: Physician Assistant

## 2020-02-24 DIAGNOSIS — E559 Vitamin D deficiency, unspecified: Secondary | ICD-10-CM

## 2020-02-24 LAB — CBC WITH DIFFERENTIAL/PLATELET
Basophils Absolute: 0.1 10*3/uL (ref 0.0–0.2)
Basos: 1 %
EOS (ABSOLUTE): 0.2 10*3/uL (ref 0.0–0.4)
Eos: 3 %
Hematocrit: 40.5 % (ref 34.0–46.6)
Hemoglobin: 13.3 g/dL (ref 11.1–15.9)
Immature Grans (Abs): 0 10*3/uL (ref 0.0–0.1)
Immature Granulocytes: 0 %
Lymphocytes Absolute: 3.5 10*3/uL — ABNORMAL HIGH (ref 0.7–3.1)
Lymphs: 57 %
MCH: 29.4 pg (ref 26.6–33.0)
MCHC: 32.8 g/dL (ref 31.5–35.7)
MCV: 89 fL (ref 79–97)
Monocytes Absolute: 0.5 10*3/uL (ref 0.1–0.9)
Monocytes: 7 %
Neutrophils Absolute: 2 10*3/uL (ref 1.4–7.0)
Neutrophils: 32 %
Platelets: 249 10*3/uL (ref 150–450)
RBC: 4.53 x10E6/uL (ref 3.77–5.28)
RDW: 13.2 % (ref 11.7–15.4)
WBC: 6.3 10*3/uL (ref 3.4–10.8)

## 2020-02-24 LAB — COMPREHENSIVE METABOLIC PANEL
ALT: 32 IU/L (ref 0–32)
AST: 22 IU/L (ref 0–40)
Albumin/Globulin Ratio: 1.3 (ref 1.2–2.2)
Albumin: 4 g/dL (ref 3.8–4.8)
Alkaline Phosphatase: 117 IU/L (ref 44–121)
BUN/Creatinine Ratio: 20 (ref 9–23)
BUN: 17 mg/dL (ref 6–24)
Bilirubin Total: 0.2 mg/dL (ref 0.0–1.2)
CO2: 23 mmol/L (ref 20–29)
Calcium: 8.8 mg/dL (ref 8.7–10.2)
Chloride: 102 mmol/L (ref 96–106)
Creatinine, Ser: 0.86 mg/dL (ref 0.57–1.00)
GFR calc Af Amer: 93 mL/min/{1.73_m2} (ref >59)
GFR calc non Af Amer: 81 mL/min/{1.73_m2} (ref >59)
Globulin, Total: 3.2 g/dL (ref 1.5–4.5)
Glucose: 100 mg/dL — ABNORMAL HIGH (ref 65–99)
Potassium: 4.3 mmol/L (ref 3.5–5.2)
Sodium: 138 mmol/L (ref 134–144)
Total Protein: 7.2 g/dL (ref 6.0–8.5)

## 2020-02-24 LAB — TSH: TSH: 4.32 u[IU]/mL (ref 0.450–4.500)

## 2020-02-24 LAB — HEMOGLOBIN A1C
Est. average glucose Bld gHb Est-mCnc: 120 mg/dL
Hgb A1c MFr Bld: 5.8 % — ABNORMAL HIGH (ref 4.8–5.6)

## 2020-02-24 LAB — LIPID PANEL WITH LDL/HDL RATIO
Cholesterol, Total: 226 mg/dL — ABNORMAL HIGH (ref 100–199)
HDL: 34 mg/dL — ABNORMAL LOW (ref >39)
LDL Chol Calc (NIH): 163 mg/dL — ABNORMAL HIGH (ref 0–99)
LDL/HDL Ratio: 4.8 ratio — ABNORMAL HIGH (ref 0.0–3.2)
Triglycerides: 157 mg/dL — ABNORMAL HIGH (ref 0–149)
VLDL Cholesterol Cal: 29 mg/dL (ref 5–40)

## 2020-02-24 LAB — VITAMIN D 25 HYDROXY (VIT D DEFICIENCY, FRACTURES): Vit D, 25-Hydroxy: 10 ng/mL — ABNORMAL LOW (ref 30.0–100.0)

## 2020-02-24 MED ORDER — VITAMIN D (ERGOCALCIFEROL) 1.25 MG (50000 UNIT) PO CAPS: 50000.0000 [IU] | ORAL_CAPSULE | ORAL | 1 refills | Status: AC

## 2020-02-24 NOTE — Progress Notes (Signed)
Vit D def noted on labs so high dose Vit D sent in

## 2020-03-01 ENCOUNTER — Other Ambulatory Visit: Payer: Self-pay

## 2020-03-01 ENCOUNTER — Encounter: Payer: Managed Care, Other (non HMO) | Attending: Physician Assistant | Admitting: Dietician

## 2020-03-01 ENCOUNTER — Encounter: Payer: Self-pay | Admitting: Dietician

## 2020-03-01 VITALS — Ht 62.0 in | Wt 215.5 lb

## 2020-03-01 DIAGNOSIS — Z6838 Body mass index (BMI) 38.0-38.9, adult: Secondary | ICD-10-CM | POA: Diagnosis not present

## 2020-03-01 DIAGNOSIS — E6609 Other obesity due to excess calories: Secondary | ICD-10-CM

## 2020-03-01 NOTE — Progress Notes (Signed)
Medical Nutrition Therapy: Visit start time: 0900  end time: 1000  Assessment:  Diagnosis: obesity Past medical history: HTN, hyperlipidemia Psychosocial issues/ stress concerns: depression, history of anxiety, high stress level reported by patient  Preferred learning method:   Auditory  Visual  Hands-on   Current weight: 213.6lbs  Height: 5'2" Medications, supplements: reconciled list in medical record  Progress and evaluation:   Patient reports weight gain since 2014 due to stress, decreased exercise. Used to be very active, Airline pilot and played football.  Lost about 12lbs a few months ago by decreasing sugar intake; then work hours increased to 11 hour days and was unable to maintain healthy meal prep; then began eating out more often. Has reduced overtime hours as of this week.   Daughter who lives with patient recently got a puppy and leaves in patient's care in evenings.   Patient reports dealing with stress by eating and crafting.  Physical activity: none recently; plans to resume gym workouts  Dietary Intake:  Usual eating pattern includes 3 meals and 2 snacks per day. Dining out frequency: 6 meals per week.  Breakfast: 9:45 break at work -- coffee 2Tbsp sugar, cereal or breakfast bar Snack: none Lunch: vegan burger or hot dog with baked beans; sometimes snack type foods Snack: often chocolate ie Heath bar (was eating 2 hershey kisses or sweet baby food to satisfy sweet craving) Supper: after 8pm-- usually fast food ie sandwich and fries, cracker barrel, longhorn Snack: snack cake after dinner Beverages: water, coffee, occasional soda maybe 2x a week  Nutrition Care Education: Topics covered:  Basic nutrition: basic food groups, appropriate nutrient balance, appropriate meal and snack schedule, general nutrition guidelines    Weight control: determining reasonable weight loss rate; importance of low sugar and low fat choices, controlling carb intake and healthy  carb choices; healthy food portions; discussed general kcal goal of 1200-1500kcal daily for weight loss for most women; effects of stress on weight and appetite; role of exercise Advanced nutrition:  cooking techniques -- meal prep and planned-over meals, batch cooking; healthy restaurant options   Nutritional Diagnosis:  [place]-3.3 Overweight/obesity As related to excess calories and inactivity, high stress level.  As evidenced by patient report of diet and lifestyle history, and current BMI of 39.1. NI-5.8.2 Excessive carbohydrate intake As related to sweet snacks and added sugars, fast food meals.  As evidenced by patient report of dietary intake.  Intervention:   Instruction and discussion as noted above.  Patient has taken measures to reduce workload and make time to take care of herself, and has concrete plans to have more homemade meals and increase exercise.  Established nutrition goals with direction from patient.   Performed body composition measurement on InBody scale --body fat = 52%, skeletal muscle mass 56.4lbs.  Education Materials given:   Museum/gallery conservator with food lists  [employer]  Planned Overs handout  InBody measurement printout  Visit summary with goals/ instructions   Learner/ who was taught:   Patient   Level of understanding:  Verbalizes/ demonstrates competency   Demonstrated degree of understanding via:   Teach back Learning barriers:  None  Willingness to learn/ readiness for change:  Eager, change in progress   Monitoring and Evaluation:  Dietary intake, exercise, and body weight      follow up: 04/07/19 at 9:00am

## 2020-03-01 NOTE — Patient Instructions (Signed)
   Switch to Kind bar or graham crackers to satisfy sweet taste. If having a chocolate craving, a small portion of individual pieces like Annabell Sabal kisses is a good option -- take a few and put the rest away.   Plan to eat more dinner meals at home, pre-prep foods to make cooking time each day a little quicker. Or cook extra for leftovers or to make other meals.   Resume some regular exercise at the gym and/or treadmill at home.

## 2020-04-06 ENCOUNTER — Ambulatory Visit: Payer: Managed Care, Other (non HMO) | Admitting: Dietician

## 2020-05-20 ENCOUNTER — Encounter: Payer: Self-pay | Admitting: Dietician

## 2020-05-20 NOTE — Progress Notes (Signed)
Have not heard back from patient to reschedule her missed appointment from 04/06/20. Sent notification to referring provider.

## 2020-06-14 ENCOUNTER — Telehealth: Payer: Self-pay

## 2020-06-14 NOTE — Telephone Encounter (Signed)
Copied from [place] [phone]. Topic: General - Call Back - No Documentation >> Jun 14, 2020  1:29 PM Erick Blinks wrote: Reason for CRM:   Pt's sister called reporting that the pt's paperwork for FMLA was not completed correctly

## 2020-06-22 ENCOUNTER — Telehealth: Payer: Self-pay | Admitting: Physician Assistant

## 2020-06-22 DIAGNOSIS — I10 Essential (primary) hypertension: Secondary | ICD-10-CM

## 2020-06-22 MED ORDER — METOPROLOL SUCCINATE ER 50 MG PO TB24: 50.0000 mg | ORAL_TABLET | Freq: Every day | ORAL | 3 refills | Status: DC

## 2020-06-22 NOTE — Telephone Encounter (Signed)
[facility] faxed refill request for the following medications:  metoprolol succinate (TOPROL-XL) 50 MG 24 hr tablet  Last Rx: 07/10/19 LOV: 02/19/20 Please advise. Thanks TNP

## 2020-06-22 NOTE — Telephone Encounter (Signed)
Is it okay to refill this medication? I saw last refill was only 3 pills, please advise.

## 2020-06-22 NOTE — Telephone Encounter (Signed)
refilled 

## 2020-06-29 ENCOUNTER — Encounter: Payer: Self-pay | Admitting: Physician Assistant

## 2020-12-22 ENCOUNTER — Other Ambulatory Visit: Payer: Self-pay

## 2020-12-22 DIAGNOSIS — Z1231 Encounter for screening mammogram for malignant neoplasm of breast: Secondary | ICD-10-CM

## 2020-12-22 NOTE — Progress Notes (Signed)
ordered

## 2021-02-01 ENCOUNTER — Ambulatory Visit
Admission: RE | Admit: 2021-02-01 | Discharge: 2021-02-01 | Disposition: A | Discharge status: Home / Self Care | Payer: Managed Care, Other (non HMO) | Source: Ambulatory Visit | Attending: Family Medicine | Admitting: Family Medicine

## 2021-02-01 ENCOUNTER — Other Ambulatory Visit: Payer: Self-pay

## 2021-02-01 DIAGNOSIS — Z1231 Encounter for screening mammogram for malignant neoplasm of breast: Secondary | ICD-10-CM | POA: Insufficient documentation

## 2021-02-15 ENCOUNTER — Telehealth: Payer: Self-pay

## 2021-02-15 DIAGNOSIS — K2100 Gastro-esophageal reflux disease with esophagitis, without bleeding: Secondary | ICD-10-CM

## 2021-02-15 NOTE — Telephone Encounter (Signed)
Copied from [place] [id number]. Topic: Referral - Request for Referral >> Feb 15, 2021 11:07 AM Alanda Slim E wrote: Has patient seen PCP for this complaint? Yes when jennifer was here  *If NO, is insurance requiring patient see PCP for this issue before PCP can refer them? Referral for which specialty: Gertie Fey  Preferred provider/office: Dr. Doylene Canning / [facility]  Reason for referral: stomach issues of and on for two years / this yr pts mom passed from pancreatic cancer / so pt wants to make sure she is ok

## 2021-02-24 ENCOUNTER — Other Ambulatory Visit: Payer: Self-pay | Admitting: Nurse Practitioner

## 2021-02-24 DIAGNOSIS — R1013 Epigastric pain: Secondary | ICD-10-CM

## 2021-03-13 ENCOUNTER — Other Ambulatory Visit: Payer: Self-pay | Admitting: Nurse Practitioner

## 2021-03-14 ENCOUNTER — Ambulatory Visit
Admission: RE | Admit: 2021-03-14 | Discharge: 2021-03-14 | Disposition: A | Discharge status: Home / Self Care | Payer: Managed Care, Other (non HMO) | Source: Ambulatory Visit | Attending: Nurse Practitioner | Admitting: Nurse Practitioner

## 2021-03-14 ENCOUNTER — Other Ambulatory Visit: Payer: Self-pay

## 2021-03-14 DIAGNOSIS — R1013 Epigastric pain: Secondary | ICD-10-CM

## 2021-03-14 MED ORDER — IOHEXOL 300 MG/ML  SOLN
100.0000 mL | Freq: Once | INTRAMUSCULAR | Status: AC | PRN
  Administered 2021-03-14: 15:00:00 100 mL via INTRAVENOUS

## 2021-05-19 ENCOUNTER — Ambulatory Visit: Admission: RE | Admit: 2021-05-19 | Payer: Managed Care, Other (non HMO) | Source: Ambulatory Visit

## 2021-05-19 ENCOUNTER — Encounter: Admission: RE | Payer: Self-pay | Source: Ambulatory Visit

## 2021-05-19 SURGERY — COLONOSCOPY WITH PROPOFOL
Anesthesia: General

## 2021-06-06 ENCOUNTER — Other Ambulatory Visit: Payer: Self-pay | Admitting: Physician Assistant

## 2021-06-06 DIAGNOSIS — I10 Essential (primary) hypertension: Secondary | ICD-10-CM

## 2021-07-14 ENCOUNTER — Telehealth: Payer: Self-pay | Admitting: Physician Assistant

## 2021-07-14 NOTE — Telephone Encounter (Signed)
Copied from [place]. Topic: General - Other ?>> Jul 14, 2021  9:36 AM Anna [name] wrote: ?Reason for CRM: Patient called in needing [name] refill on her metoprolol succinate (TOPROL-XL) 50 MG 24 hr tablet  for about [name] [id number] [place] [place]: [phone]:  [phone]?Patient scheduled to be seen on 07/18/21 ?

## 2021-07-15 ENCOUNTER — Other Ambulatory Visit: Payer: Self-pay | Admitting: Physician Assistant

## 2021-07-15 DIAGNOSIS — I10 Essential (primary) hypertension: Secondary | ICD-10-CM

## 2021-07-15 MED ORDER — METOPROLOL SUCCINATE ER 50 MG PO TB24: 50.0000 mg | ORAL_TABLET | Freq: Every day | ORAL | 0 refills | Status: DC

## 2021-07-15 NOTE — Progress Notes (Signed)
Rx is ordered

## 2021-07-16 ENCOUNTER — Other Ambulatory Visit: Payer: Self-pay | Admitting: Physician Assistant

## 2021-07-17 ENCOUNTER — Other Ambulatory Visit: Payer: Self-pay | Admitting: Family Medicine

## 2021-07-17 DIAGNOSIS — I10 Essential (primary) hypertension: Secondary | ICD-10-CM

## 2021-07-17 MED ORDER — METOPROLOL SUCCINATE ER 50 MG PO TB24: 50.0000 mg | ORAL_TABLET | Freq: Every day | ORAL | 0 refills | Status: DC

## 2021-07-17 NOTE — Telephone Encounter (Signed)
Rx sent on 07-17-21 ?

## 2021-07-17 NOTE — Progress Notes (Signed)
?  ? ? ?I,Roshena L Chambers,acting as a Education administrator for [employer], PA-C.,have documented all relevant documentation on the behalf of Mardene Speak, PA-C,as directed by  [employer], PA-C while in the presence of [employer], PA-C.  ? ? ?Established patient visit ? ? ?Patient: Anna [name]   DOB: Aug 16, 1970   51 y.o. Female  MRN: [id number] ?Visit Date: 07/18/2021 ? ?Today's healthcare provider: Mardene Speak, PA-C  ? ?CC; medication refill, establish care with new doctor/APP ? ?Subjective  ?  ?HPI  ? ?1. Obesity  ? ?Adheres to vegan/plant based diet and start going to Gym a mo ago. ?Did bodybuilding in the mid 76s and played Football. ?"Let her self go when she gave birth to her daughter." Her daughter is 33 yo. ?  ?2. Hypercholesterolemia without hypertriglyceridemia ?Plant based diet. Does not take statin ? ?3. Borderline diabetes ?Plant based diet and start gym ?Last A1C 2 years ago ?  ?4. Avitaminosis D ?Does not take Vitamin D ?Hx of vit D deficiency ? ?5. HTN ?Does have a blood pressure cuff at home. Does not measure her bp at home.Takes metoprolol succinate 50 mg ? ?6.Colon cancer screening ?Hx of positive for rectal cancer in her family-father ? ?Medications: ?Outpatient Medications Prior to Visit  ?Medication Sig  ? [DISCONTINUED] metoprolol succinate (TOPROL-XL) 50 MG 24 hr tablet Take 1 tablet (50 mg total) by mouth daily. Take with or immediately following a meal.  ? Vitamin D, Ergocalciferol, (DRISDOL) 1.25 MG (50000 UNIT) CAPS capsule Take 1 capsule (50,000 Units total) by mouth every 7 (seven) days. (Patient not taking: Reported on 07/18/2021)  ? ?No facility-administered medications prior to visit.  ? ? ?Review of Systems  ?Constitutional: Negative.   ?Respiratory: Negative.    ?Cardiovascular: Negative.   ?Gastrointestinal: Negative.   ? ? ?  Objective  ?  ?BP (!) 170/102 (BP Location: Left Arm, Patient Position: Sitting, Cuff Size: Large)   Pulse 72   Temp 98.8 ?F (37.1 ?C) (Oral)   Resp 16    Wt 220 lb (99.8 kg)   LMP 04/09/2017 (Approximate)   SpO2 100% Comment: room air  BMI 40.24 kg/m?  ? ?Today's Vitals  ? 07/18/21 0947 07/18/21 1005  ?BP: (!) 173/101 (!) 170/102  ?Pulse: 72   ?Resp: 16   ?Temp: 98.8 ?F (37.1 ?C)   ?TempSrc: Oral   ?SpO2: 100%   ?Weight: 220 lb (99.8 kg)   ? ?Body mass index is 40.24 kg/m?.  ? ? ?Physical Exam ?Vitals and nursing note reviewed.  ?Constitutional:   ?   Appearance: Normal appearance. She is obese.  ?HENT:  ?   Head: Normocephalic and atraumatic.  ?Eyes:  ?   Extraocular Movements: Extraocular movements intact.  ?   Pupils: Pupils are equal, round, and reactive to light.  ?Cardiovascular:  ?   Rate and Rhythm: Normal rate and regular rhythm.  ?   Pulses: Normal pulses.  ?   Heart sounds: Normal heart sounds.  ?Pulmonary:  ?   Effort: Pulmonary effort is normal.  ?   Breath sounds: Normal breath sounds.  ?Abdominal:  ?   General: Abdomen is flat. Bowel sounds are normal.  ?   Palpations: Abdomen is soft.  ?Neurological:  ?   Mental Status: She is alert and oriented to person, place, and time.  ?Psychiatric:     ?   Behavior: Behavior normal.     ?   Thought Content: Thought content normal.     ?  Judgment: Judgment normal.  ?  ? ?No results found for any visits on 07/18/21. ? Assessment & Plan  ?  ? ?  ?1. Essential hypertension ?BP 173/101 and 142/115-second reading ?- Comprehensive metabolic panel ?- CBC with Differential/Platelet ?- metoprolol succinate (TOPROL-XL) 25 MG 24 hr tablet; Take 1 tablet (25 mg total) by mouth daily.  Dispense: 60 tablet; Refill: 0 ?- metoprolol succinate (TOPROL-XL) 50 MG 24 hr tablet; Take 1 tablet (50 mg total) by mouth daily. Take with or immediately following a meal.  Dispense: 30 tablet; Refill: 0 ? ?2. Morbid obesity ([place]) ?Body mass index is 40.24 kg/m?.  ?Will check labs as below and f/u pending results. Counseled patient on healthy lifestyle modifications including dieting and exercise.  Patient wants to try to lose weight  without medication assistance. ?- Comprehensive metabolic panel ?- CBC with Differential/Platelet ?- TSH ?- Hemoglobin A1c ?- Lipid panel ? ?3. Hypercholesterolemia without hypertriglyceridemia ?Will check labs as below and f/u pending results. ?- Lipid panel ? ?4.  Hx of Vit D deficiency ?Check Vit D level ? ?5. Borderline diabetes ?Will check labs as below and f/u pending results. ?- Hemoglobin A1c ? ?6. Colon cancer screening ?- Ambulatory referral to Gastroenterology ? ?7. Encounter for hepatitis C screening test for low risk patient ?- Hepatitis C Antibody ? ?8. Need for Tdap vaccination ?Prefers postpone ? ?Return in about 4 weeks (around 08/15/2021) for chronic disease f/u, BP f/u.  ?   ? ?The patient was advised to call back or seek an in-person evaluation if the symptoms worsen or if the condition fails to improve as anticipated. ? ?I discussed the assessment and treatment plan with the patient. The patient was provided an opportunity to ask questions and all were answered. The patient agreed with the plan and demonstrated an understanding of the instructions. ? ?The entirety of the information documented in the History of Present Illness, Review of Systems and Physical Exam were personally obtained by me. Portions of this information were initially documented by the CMA and reviewed by me for thoroughness and accuracy.   ?Portions of this note were created using dictation software and may contain typographical errors.  ? ? ?Mardene Speak, PA-C  ?[place] ?[phone] (phone) ?[phone] (fax) ? ?[facility] ?

## 2021-07-18 ENCOUNTER — Encounter: Payer: Self-pay | Admitting: Physician Assistant

## 2021-07-18 ENCOUNTER — Ambulatory Visit (INDEPENDENT_AMBULATORY_CARE_PROVIDER_SITE_OTHER): Payer: Managed Care, Other (non HMO) | Admitting: Physician Assistant

## 2021-07-18 VITALS — BP 170/102 | HR 72 | Temp 98.8°F | Resp 16 | Wt 220.0 lb

## 2021-07-18 DIAGNOSIS — K2100 Gastro-esophageal reflux disease with esophagitis, without bleeding: Secondary | ICD-10-CM

## 2021-07-18 DIAGNOSIS — E559 Vitamin D deficiency, unspecified: Secondary | ICD-10-CM

## 2021-07-18 DIAGNOSIS — Z1159 Encounter for screening for other viral diseases: Secondary | ICD-10-CM

## 2021-07-18 DIAGNOSIS — I10 Essential (primary) hypertension: Secondary | ICD-10-CM

## 2021-07-18 DIAGNOSIS — Z7689 Persons encountering health services in other specified circumstances: Secondary | ICD-10-CM

## 2021-07-18 DIAGNOSIS — R7303 Prediabetes: Secondary | ICD-10-CM | POA: Diagnosis not present

## 2021-07-18 DIAGNOSIS — Z23 Encounter for immunization: Secondary | ICD-10-CM

## 2021-07-18 DIAGNOSIS — E7800 Pure hypercholesterolemia, unspecified: Secondary | ICD-10-CM

## 2021-07-18 DIAGNOSIS — Z1211 Encounter for screening for malignant neoplasm of colon: Secondary | ICD-10-CM

## 2021-07-18 MED ORDER — METOPROLOL SUCCINATE ER 25 MG PO TB24: 25.0000 mg | ORAL_TABLET | Freq: Every day | ORAL | 0 refills | Status: DC

## 2021-07-18 MED ORDER — METOPROLOL SUCCINATE ER 50 MG PO TB24: 50.0000 mg | ORAL_TABLET | Freq: Every day | ORAL | 0 refills | Status: DC

## 2021-07-19 ENCOUNTER — Other Ambulatory Visit: Payer: Self-pay

## 2021-07-19 ENCOUNTER — Telehealth: Payer: Self-pay

## 2021-07-19 ENCOUNTER — Encounter: Payer: Self-pay | Admitting: Physician Assistant

## 2021-07-19 DIAGNOSIS — Z1211 Encounter for screening for malignant neoplasm of colon: Secondary | ICD-10-CM

## 2021-07-19 LAB — CBC WITH DIFFERENTIAL/PLATELET
Basophils Absolute: 0.1 10*3/uL (ref 0.0–0.2)
Basos: 1 %
EOS (ABSOLUTE): 0.1 10*3/uL (ref 0.0–0.4)
Eos: 1 %
Hematocrit: 41 % (ref 34.0–46.6)
Hemoglobin: 13.7 g/dL (ref 11.1–15.9)
Immature Grans (Abs): 0 10*3/uL (ref 0.0–0.1)
Immature Granulocytes: 0 %
Lymphocytes Absolute: 2.8 10*3/uL (ref 0.7–3.1)
Lymphs: 45 %
MCH: 29.5 pg (ref 26.6–33.0)
MCHC: 33.4 g/dL (ref 31.5–35.7)
MCV: 88 fL (ref 79–97)
Monocytes Absolute: 0.4 10*3/uL (ref 0.1–0.9)
Monocytes: 7 %
Neutrophils Absolute: 2.9 10*3/uL (ref 1.4–7.0)
Neutrophils: 46 %
Platelets: 257 10*3/uL (ref 150–450)
RBC: 4.64 x10E6/uL (ref 3.77–5.28)
RDW: 13.4 % (ref 11.7–15.4)
WBC: 6.3 10*3/uL (ref 3.4–10.8)

## 2021-07-19 LAB — COMPREHENSIVE METABOLIC PANEL
ALT: 17 IU/L (ref 0–32)
AST: 21 IU/L (ref 0–40)
Albumin/Globulin Ratio: 1.3 (ref 1.2–2.2)
Albumin: 4 g/dL (ref 3.8–4.8)
Alkaline Phosphatase: 108 IU/L (ref 44–121)
BUN/Creatinine Ratio: 10 (ref 9–23)
BUN: 8 mg/dL (ref 6–24)
Bilirubin Total: 0.3 mg/dL (ref 0.0–1.2)
CO2: 24 mmol/L (ref 20–29)
Calcium: 9.2 mg/dL (ref 8.7–10.2)
Chloride: 103 mmol/L (ref 96–106)
Creatinine, Ser: 0.8 mg/dL (ref 0.57–1.00)
Globulin, Total: 3.2 g/dL (ref 1.5–4.5)
Glucose: 102 mg/dL — ABNORMAL HIGH (ref 70–99)
Potassium: 4.1 mmol/L (ref 3.5–5.2)
Sodium: 140 mmol/L (ref 134–144)
Total Protein: 7.2 g/dL (ref 6.0–8.5)
eGFR: 90 mL/min/{1.73_m2} (ref >59)

## 2021-07-19 LAB — LIPID PANEL
Chol/HDL Ratio: 5.9 ratio — ABNORMAL HIGH (ref 0.0–4.4)
Cholesterol, Total: 229 mg/dL — ABNORMAL HIGH (ref 100–199)
HDL: 39 mg/dL — ABNORMAL LOW (ref >39)
LDL Chol Calc (NIH): 165 mg/dL — ABNORMAL HIGH (ref 0–99)
Triglycerides: 135 mg/dL (ref 0–149)
VLDL Cholesterol Cal: 25 mg/dL (ref 5–40)

## 2021-07-19 LAB — HEMOGLOBIN A1C
Est. average glucose Bld gHb Est-mCnc: 123 mg/dL
Hgb A1c MFr Bld: 5.9 % — ABNORMAL HIGH (ref 4.8–5.6)

## 2021-07-19 LAB — TSH: TSH: 1.57 u[IU]/mL (ref 0.450–4.500)

## 2021-07-19 LAB — VITAMIN D 25 HYDROXY (VIT D DEFICIENCY, FRACTURES): Vit D, 25-Hydroxy: 8.7 ng/mL — ABNORMAL LOW (ref 30.0–100.0)

## 2021-07-19 LAB — HEPATITIS C ANTIBODY: Hep C Virus Ab: NONREACTIVE

## 2021-07-19 MED ORDER — NA SULFATE-K SULFATE-MG SULF 17.5-3.13-1.6 GM/177ML PO SOLN: 1.0000 | Freq: Once | ORAL | 0 refills | Status: AC

## 2021-07-19 NOTE — Progress Notes (Signed)
Gastroenterology Pre-Procedure Review ? ?Request Date: 09/15/2021 ?Requesting Physician: Dr. Marius Ditch ? ? ?PATIENT REVIEW QUESTIONS: The patient responded to the following health history questions as indicated:   ? ?1. Are you having any GI issues? no ?2. Do you have a personal history of Polyps? no ?3. Do you have a family history of Colon Cancer or Polyps? yes (rectal cancer) ?4. Diabetes Mellitus? no ?5. Joint replacements in the past 12 months?no ?6. Major health problems in the past 3 months?no ?7. Any artificial heart valves, MVP, or defibrillator?no ?   ?MEDICATIONS & ALLERGIES:    ?Patient reports the following regarding taking any anticoagulation/antiplatelet therapy:   ?Plavix, Coumadin, Eliquis, Xarelto, Lovenox, Pradaxa, Brilinta, or Effient? no ?Aspirin? no ? ?Patient confirms/reports the following medications:  ?Current Outpatient Medications  ?Medication Sig Dispense Refill  ? metoprolol succinate (TOPROL-XL) 25 MG 24 hr tablet Take 1 tablet (25 mg total) by mouth daily. 60 tablet 0  ? metoprolol succinate (TOPROL-XL) 50 MG 24 hr tablet Take 1 tablet (50 mg total) by mouth daily. Take with or immediately following a meal. 30 tablet 0  ? Vitamin D, Ergocalciferol, (DRISDOL) 1.25 MG (50000 UNIT) CAPS capsule Take 1 capsule (50,000 Units total) by mouth every 7 (seven) days. (Patient not taking: Reported on 07/18/2021) 12 capsule 1  ? ?No current facility-administered medications for this visit.  ? ? ?Patient confirms/reports the following allergies:  ?No Known Allergies ? ?No orders of the defined types were placed in this encounter. ? ? ?AUTHORIZATION INFORMATION ?Primary Insurance: ?1D#: ?Group #: ? ?Secondary Insurance: ?1D#: ?Group #: ? ?SCHEDULE INFORMATION: ?Date: 09/15/2021 ?Time: ?Location:[facility] ? ?

## 2021-07-19 NOTE — Telephone Encounter (Signed)
CALLED PATIENT NO ANSWER LEFT VOICEMAIL FOR A CALL BACK ? ?

## 2021-08-01 ENCOUNTER — Other Ambulatory Visit: Payer: Self-pay | Admitting: Physician Assistant

## 2021-08-01 DIAGNOSIS — I10 Essential (primary) hypertension: Secondary | ICD-10-CM

## 2021-08-01 MED ORDER — METOPROLOL SUCCINATE ER 25 MG PO TB24: 25.0000 mg | ORAL_TABLET | Freq: Every day | ORAL | 0 refills | Status: DC

## 2021-08-01 MED ORDER — METOPROLOL SUCCINATE ER 50 MG PO TB24: 50.0000 mg | ORAL_TABLET | Freq: Every day | ORAL | 0 refills | Status: DC

## 2021-08-18 ENCOUNTER — Ambulatory Visit: Payer: Managed Care, Other (non HMO) | Admitting: Physician Assistant

## 2021-08-18 ENCOUNTER — Telehealth: Payer: Self-pay | Admitting: Physician Assistant

## 2021-08-18 NOTE — Telephone Encounter (Signed)
[facility] pharmacy faxed refill request for the following medications:   metoprolol succinate (TOPROL-XL) 25 MG 24 hr tablet  Please advise

## 2021-08-18 NOTE — Telephone Encounter (Signed)
Checked with pt to make sure she did not need this refilled. Rx sent for both 25 mg and 50 mg on 08-01-21.  Reports she has plenty.  States there was some confusion with [facility] and [facility].

## 2021-08-24 NOTE — Progress Notes (Unsigned)
I,Jana Sherese Heyward,acting as a Education administrator for [employer], PA-C.,have documented all relevant documentation on the behalf of Mardene Speak, PA-C,as directed by  [employer], PA-C while in the presence of [employer], PA-C.   Established patient visit   Patient: Anna [name]   DOB: November 17, 1970   51 y.o. Female  MRN: [id number] Visit Date: 08/25/2021  Today's healthcare provider: Mardene Speak, PA-C   Chief Complaint  Patient presents with   Hypertension  CC: follow up hypertension and lab review Subjective    Hypertension, follow-up  BP Readings from Last 3 Encounters:  08/25/21 (!) 164/102  07/18/21 (!) 170/102  02/19/20 (!) 141/91   Wt Readings from Last 3 Encounters:  08/25/21 216 lb 3.2 oz (98.1 kg)  07/18/21 220 lb (99.8 kg)  03/01/20 215 lb 8 oz (97.8 kg)     She was last seen for hypertension 1 months ago.  BP at that visit was 170/102. Management since that visit includes - metoprolol succinate (TOPROL-XL) 25 MG 24 hr tablet; Take 1 tablet (25 mg total) by mouth daily.  Dispense: 60 tablet; Refill: 0 - metoprolol succinate (TOPROL-XL) 50 MG 24 hr tablet; Take 1 tablet (50 mg total) by mouth daily. Take with or immediately following a meal.   She reports excellent compliance with treatment. She is not having side effects. She is following a Regular diet. Started juicing  She is exercising. Joined gym She does smoke.  Use of agents associated with hypertension: none.   Outside blood pressures are average: checked the bottom number only 80's but sometimes higher.   Symptoms: No chest pain No chest pressure  No palpitations No syncope  No dyspnea No orthopnea  No paroxysmal nocturnal dyspnea No lower extremity edema   Pertinent labs Lab Results  Component Value Date   CHOL 229 (H) 07/18/2021   HDL 39 (L) 07/18/2021   LDLCALC 165 (H) 07/18/2021   TRIG 135 07/18/2021   CHOLHDL 5.9 (H) 07/18/2021   Lab Results  Component Value Date   NA 140 07/18/2021   K  4.1 07/18/2021   CREATININE 0.80 07/18/2021   EGFR 90 07/18/2021   GLUCOSE 102 (H) 07/18/2021   TSH 1.570 07/18/2021     The 10-year ASCVD risk score (Arnett DK, et al., 2019) is: 16.3%  --------------------------------------------------------------------------------------------------- Your labwork results all are within normal limits Except a slight increase in glucose, your A1C in prediabetic range, Cholesterol and LDL cholesterol increased, HDL cholesterol decreased, your current risk of ASCVD - The 10-year ASCVD risk score (Arnett DK, et al., 2019) is: 18.7%   Values used to calculate the score:     Age: 44 years     Sex: Female     Is Non-Hispanic African American: Yes     Diabetic: No     Tobacco smoker: No     Systolic Blood Pressure: 902 mmHg     Is BP treated: Yes     HDL Cholesterol: 39 mg/dL     Total Cholesterol: 229 mg/dL  Advised to start a new medication, statin in order to lower the risk of cardiovascular event.   We will discuss lab results in details during your next visit.   -----------------------------------------------------------------------------------------  Medications: Outpatient Medications Prior to Visit  Medication Sig   metoprolol succinate (TOPROL-XL) 25 MG 24 hr tablet Take 1 tablet (25 mg total) by mouth daily.   metoprolol succinate (TOPROL-XL) 50 MG 24 hr tablet Take 1 tablet (50 mg total) by mouth daily.  Take with or immediately following a meal.   Vitamin D, Ergocalciferol, (DRISDOL) 1.25 MG (50000 UNIT) CAPS capsule Take 1 capsule (50,000 Units total) by mouth every 7 (seven) days.   No facility-administered medications prior to visit.    Review of Systems  Constitutional: Negative.   Respiratory: Negative.    Cardiovascular: Negative.   Gastrointestinal: Negative.   See HPI  {Labs  Heme  Chem  Endocrine  Serology  Results Review (optional):23779}   Objective    BP (!) 164/102 (BP Location: Left Arm, Patient Position:  Sitting, Cuff Size: Large)   Pulse 74   Temp 97.8 F (36.6 C) (Oral)   Resp 16   Wt 216 lb 3.2 oz (98.1 kg)   LMP 03/19/2017 (Approximate) Comment: Preg Test Negative  SpO2 100%   BMI 39.54 kg/m  {Show previous vital signs (optional):23777}  Physical Exam Vitals reviewed.  Constitutional:      General: She is not in acute distress.    Appearance: Normal appearance. She is well-developed. She is not diaphoretic.  HENT:     Head: Normocephalic and atraumatic.  Eyes:     General: No scleral icterus.    Conjunctiva/sclera: Conjunctivae normal.  Neck:     Thyroid: No thyromegaly.  Cardiovascular:     Rate and Rhythm: Normal rate and regular rhythm.     Pulses: Normal pulses.     Heart sounds: Normal heart sounds. No murmur heard. Pulmonary:     Effort: Pulmonary effort is normal. No respiratory distress.     Breath sounds: Normal breath sounds. No wheezing, rhonchi or rales.  Musculoskeletal:     Cervical back: Neck supple.     Right lower leg: No edema.     Left lower leg: No edema.  Lymphadenopathy:     Cervical: No cervical adenopathy.  Skin:    General: Skin is warm and dry.     Findings: No rash.  Neurological:     Mental Status: She is alert and oriented to person, place, and time. Mental status is at baseline.  Psychiatric:        Mood and Affect: Mood normal.        Behavior: Behavior normal.       No results found for any visits on 08/25/21.  Assessment & Plan     1. Essential hypertension Today BP was 164/102 uncontrolled. She is currently under a lot of stress Advised to increase a dose of metoprolol to $RemoveBefor'100mg'YCMEypIDBIGG$  and add lisinopril Recommended to measure her BP at home and bring the BP log in to the next appt - lisinopril (ZESTRIL) 10 MG tablet; Take 1 tablet (10 mg total) by mouth daily.  Dispense: 90 tablet; Refill: 0 - metoprolol succinate (TOPROL-XL) 100 MG 24 hr tablet; Take 1 tablet (100 mg total) by mouth daily. Take with or immediately following a  meal.  Dispense: 90 tablet; Refill: 0  2. Borderline diabetes Continue lifestyle modifications  3. Morbid obesity ([place]) She is doing well on lifestyle modifications: diet and exercising Congratulated her for losing 4 ib for the past month. Encouraged to continue with lifestyle modification  4. Hypercholesterolemia without hypertriglyceridemia Continue lifestyle modifications Advised to start OTC omega-3 daily  And Red yeast rice extract with CoQ10  FU in 1 mo for chronic conditions     The patient was advised to call back or seek an in-person evaluation if the symptoms worsen or if the condition fails to improve as anticipated.  I discussed the assessment and  treatment plan with the patient. The patient was provided an opportunity to ask questions and all were answered. The patient agreed with the plan and demonstrated an understanding of the instructions.  The entirety of the information documented in the History of Present Illness, Review of Systems and Physical Exam were personally obtained by me. Portions of this information were initially documented by the CMA and reviewed by me for thoroughness and accuracy.  Portions of this note were created using dictation software and may contain typographical errors.     Mardene Speak, PA-C  [facility] [phone] (phone) [phone] (fax)  [place]

## 2021-08-25 ENCOUNTER — Ambulatory Visit: Payer: Managed Care, Other (non HMO) | Admitting: Physician Assistant

## 2021-08-25 ENCOUNTER — Encounter: Payer: Self-pay | Admitting: Physician Assistant

## 2021-08-25 VITALS — BP 164/102 | HR 74 | Temp 97.8°F | Resp 16 | Wt 216.2 lb

## 2021-08-25 DIAGNOSIS — R7303 Prediabetes: Secondary | ICD-10-CM

## 2021-08-25 DIAGNOSIS — I10 Essential (primary) hypertension: Secondary | ICD-10-CM

## 2021-08-25 DIAGNOSIS — E7800 Pure hypercholesterolemia, unspecified: Secondary | ICD-10-CM

## 2021-08-25 MED ORDER — LISINOPRIL 10 MG PO TABS: 10.0000 mg | ORAL_TABLET | Freq: Every day | ORAL | 0 refills | Status: DC

## 2021-08-25 MED ORDER — METOPROLOL SUCCINATE ER 100 MG PO TB24: 100.0000 mg | ORAL_TABLET | Freq: Every day | ORAL | 0 refills | Status: DC

## 2021-09-13 ENCOUNTER — Telehealth: Payer: Self-pay | Admitting: Physician Assistant

## 2021-09-13 DIAGNOSIS — I10 Essential (primary) hypertension: Secondary | ICD-10-CM

## 2021-09-13 NOTE — Telephone Encounter (Signed)
[facility] faxed refill request for the following medications:  metoprolol succinate (TOPROL-XL) 50 MG 24 hr tablet   Please advise.

## 2021-09-15 ENCOUNTER — Ambulatory Visit: Payer: Managed Care, Other (non HMO) | Admitting: Anesthesiology

## 2021-09-15 ENCOUNTER — Ambulatory Visit
Admission: RE | Admit: 2021-09-15 | Discharge: 2021-09-15 | Disposition: A | Discharge status: Home / Self Care | Payer: Managed Care, Other (non HMO) | Attending: Gastroenterology | Admitting: Gastroenterology

## 2021-09-15 ENCOUNTER — Encounter: Payer: Self-pay | Admitting: Gastroenterology

## 2021-09-15 ENCOUNTER — Encounter
Admission: RE | Disposition: A | Discharge status: Home / Self Care | Payer: Self-pay | Source: Home / Self Care | Attending: Gastroenterology

## 2021-09-15 DIAGNOSIS — Z1211 Encounter for screening for malignant neoplasm of colon: Secondary | ICD-10-CM | POA: Diagnosis present

## 2021-09-15 DIAGNOSIS — Z6838 Body mass index (BMI) 38.0-38.9, adult: Secondary | ICD-10-CM | POA: Insufficient documentation

## 2021-09-15 DIAGNOSIS — Z800 Family history of malignant neoplasm of digestive organs: Secondary | ICD-10-CM | POA: Diagnosis not present

## 2021-09-15 DIAGNOSIS — I10 Essential (primary) hypertension: Secondary | ICD-10-CM | POA: Diagnosis not present

## 2021-09-15 HISTORY — PX: COLONOSCOPY WITH PROPOFOL: SHX5780

## 2021-09-15 SURGERY — COLONOSCOPY WITH PROPOFOL
Anesthesia: General

## 2021-09-15 MED ORDER — SODIUM CHLORIDE 0.9 % IV SOLN
INTRAVENOUS | Status: DC
  Administered 2021-09-15: 20 mL/h via INTRAVENOUS

## 2021-09-15 MED ORDER — PROPOFOL 500 MG/50ML IV EMUL
INTRAVENOUS | Status: DC | PRN
  Administered 2021-09-15: 140 ug/kg/min via INTRAVENOUS

## 2021-09-15 MED ORDER — PROPOFOL 10 MG/ML IV BOLUS
INTRAVENOUS | Status: DC | PRN
  Administered 2021-09-15: 80 mg via INTRAVENOUS

## 2021-09-15 MED ORDER — LIDOCAINE HCL (CARDIAC) PF 100 MG/5ML IV SOSY
PREFILLED_SYRINGE | INTRAVENOUS | Status: DC | PRN
  Administered 2021-09-15: 100 mg via INTRAVENOUS

## 2021-09-15 MED ORDER — PROPOFOL 1000 MG/100ML IV EMUL
INTRAVENOUS | Status: AC
  Filled 2021-09-15: qty 100

## 2021-09-15 NOTE — Anesthesia Postprocedure Evaluation (Signed)
Anesthesia Post Note  Patient: Anna [name]  Procedure(s) Performed: COLONOSCOPY WITH PROPOFOL  Patient location during evaluation: PACU Anesthesia Type: General Level of consciousness: awake and oriented Pain management: pain level controlled Vital Signs Assessment: post-procedure vital signs reviewed and stable Respiratory status: spontaneous breathing and nonlabored ventilation Cardiovascular status: stable Anesthetic complications: no   No notable events documented.   Last Vitals:  Vitals:   09/15/21 0816 09/15/21 0826  BP: (!) 122/92 125/85  Pulse:    Resp:    Temp:    SpO2:      Last Pain:  Vitals:   09/15/21 0826  TempSrc:   PainSc: 0-No pain                 VAN STAVEREN,Anisten Tomassi

## 2021-09-15 NOTE — H&P (Signed)
Cephas Darby, MD [address]  [place]  [place], [place] [address]  Main: [phone]  Fax: [phone] Pager: [phone]  Primary Care Physician:  Mardene Speak, PA-C Primary Gastroenterologist:  Dr. Cephas Darby  Pre-Procedure History & Physical: HPI:  YOMARIS PALECEK is a 51 y.o. female is here for an colonoscopy.   Past Medical History:  Diagnosis Date   Hypertension     Past Surgical History:  Procedure Laterality Date   FINGER ARTHROSCOPY WITH EXTENSIVE DEBRIDEMENT Right 12/04/2017   Procedure: DEBRIDEMENT WITH BONE GRAFTING OF A PRESUMED ENCHONDROMA OF THE RIGHT LING MIDDLE PHALANX;  Surgeon: Corky Mull, MD;  Location: [facility];  Service: Orthopedics;  Laterality: Right;  CANCELLOUS CHIPS AND DEMINERALIZED BONE  MATRIX PUTTY AVAILABLE FOR GRAFTING VERY SMALL OSTEOTOMES AND CURETTES   LAPAROSCOPIC APPENDECTOMY N/A 09/22/2017   Procedure: APPENDECTOMY LAPAROSCOPIC;  Surgeon: Vickie Epley, MD;  Location: [facility];  Service: General;  Laterality: N/A;    Prior to Admission medications   Medication Sig Start Date End Date Taking? Authorizing Provider  lisinopril (ZESTRIL) 10 MG tablet Take 1 tablet (10 mg total) by mouth daily. 08/25/21  Yes Ostwalt, Letitia Libra, PA-C  metoprolol succinate (TOPROL-XL) 100 MG 24 hr tablet Take 1 tablet (100 mg total) by mouth daily. Take with or immediately following a meal. 08/25/21  Yes Ostwalt, Janna, PA-C  Vitamin D, Ergocalciferol, (DRISDOL) 1.25 MG (50000 UNIT) CAPS capsule Take 1 capsule (50,000 Units total) by mouth every 7 (seven) days. 02/24/20  Yes Mar Daring, PA-C    Allergies as of 07/19/2021   (No Known Allergies)    Family History  Problem Relation Age of Onset   Stroke Mother    Hypertension Mother    Heart attack Mother    Breast cancer Mother 56   Colon cancer Father    COPD Father    Hypertension Father    Heart disease Father    Hypertension Sister    Fibroids Sister    Obesity  Sister    Ovarian cancer Sister 85   Obesity Sister    Breast cancer Maternal Aunt     Social History   Socioeconomic History   Marital status: Single    Spouse name: Not on file   Number of children: Not on file   Years of education: Not on file   Highest education level: Not on file  Occupational History    Employer: [employer]  Tobacco Use   Smoking status: Never   Smokeless tobacco: Never  Vaping Use   Vaping Use: Never used  Substance and Sexual Activity   Alcohol use: Yes    Alcohol/week: 0.0 standard drinks of alcohol    Comment: OCCASIONALLY (3-4x/yr)   Drug use: No   Sexual activity: Not on file  Other Topics Concern   Not on file  Social History Narrative   Not on file   Social Determinants of Health   Financial Resource Strain: Not on file  Food Insecurity: Not on file  Transportation Needs: Not on file  Physical Activity: Not on file  Stress: Not on file  Social Connections: Not on file  Intimate Partner Violence: Not on file    Review of Systems: See HPI, otherwise negative ROS  Physical Exam: BP (!) 163/101   Pulse 77   Temp (!) 96.4 F (35.8 C) (Temporal)   Resp 20   Ht '5\' 2"'$  (1.575 m)   Wt 95.7 kg   LMP 03/19/2017 (  Approximate) Comment: Preg Test Negative  SpO2 100%   BMI 38.59 kg/m  General:   Alert,  pleasant and cooperative in NAD Head:  Normocephalic and atraumatic. Neck:  Supple; no masses or thyromegaly. Lungs:  Clear throughout to auscultation.    Heart:  Regular rate and rhythm. Abdomen:  Soft, nontender and nondistended. Normal bowel sounds, without guarding, and without rebound.   Neurologic:  Alert and  oriented x4;  grossly normal neurologically.  Impression/Plan: TIFFANY CALMES is here for an colonoscopy to be performed for colon cancer screening  Risks, benefits, limitations, and alternatives regarding  colonoscopy have been reviewed with the patient.  Questions have been answered.  All parties agreeable.   Sherri Sear, MD  09/15/2021, 7:41 AM

## 2021-09-15 NOTE — Op Note (Signed)
[facility] Patient Name: Anna [name] Procedure Date: 09/15/2021 7:07 AM MRN: [id number] Account #: [id number] Date of Birth: [birth year] Admit Type: Outpatient Age: 51 Room: [facility] ENDO ROOM 2 Gender: Female Note Status: Finalized Instrument Name: Jasper Riling [id number] Procedure:             Colonoscopy Indications:           Screening in patient at increased risk: Colorectal                         cancer in father before age 25 Providers:             Fotios Amos Raeanne Gathers MD, MD Referring MD:          Mardene Speak pa-c Medicines:             General Anesthesia Complications:         No immediate complications. Estimated blood loss: None. Procedure:             Pre-Anesthesia Assessment:                        - Prior to the procedure, a History and Physical was                         performed, and patient medications and allergies were                         reviewed. The patient is competent. The risks and                         benefits of the procedure and the sedation options and                         risks were discussed with the patient. All questions                         were answered and informed consent was obtained.                         Patient identification and proposed procedure were                         verified by the physician, the nurse, the                         anesthesiologist, the anesthetist and the technician                         in the pre-procedure area in the procedure room in the                         endoscopy suite. Mental Status Examination: alert and                         oriented. Airway Examination: normal oropharyngeal                         airway and neck mobility. Respiratory Examination:  clear to auscultation. CV Examination: normal.                         Prophylactic Antibiotics: The patient does not require                         prophylactic antibiotics. Prior  Anticoagulants: The                         patient has taken no previous anticoagulant or                         antiplatelet agents. ASA Grade Assessment: II - A                         patient with mild systemic disease. After reviewing                         the risks and benefits, the patient was deemed in                         satisfactory condition to undergo the procedure. The                         anesthesia plan was to use general anesthesia.                         Immediately prior to administration of medications,                         the patient was re-assessed for adequacy to receive                         sedatives. The heart rate, respiratory rate, oxygen                         saturations, blood pressure, adequacy of pulmonary                         ventilation, and response to care were monitored                         throughout the procedure. The physical status of the                         patient was re-assessed after the procedure.                        After obtaining informed consent, the colonoscope was                         passed under direct vision. Throughout the procedure,                         the patient's blood pressure, pulse, and oxygen                         saturations were monitored continuously. The  Colonoscope was introduced through the anus and                         advanced to the the cecum, identified by appendiceal                         orifice and ileocecal valve. The colonoscopy was                         performed without difficulty. The patient tolerated                         the procedure well. The quality of the bowel                         preparation was evaluated using the BBPS [facility] Bowel                         Preparation Scale) with scores of: Right Colon = 3,                         Transverse Colon = 3 and Left Colon = 3 (entire mucosa                         seen well with no  residual staining, small fragments                         of stool or opaque liquid). The total BBPS score                         equals 9. Findings:      The perianal and digital rectal examinations were normal. Pertinent       negatives include normal sphincter tone and no palpable rectal lesions.      The entire examined colon appeared normal.      The retroflexed view of the distal rectum and anal verge was normal and       showed no anal or rectal abnormalities. Impression:            - The entire examined colon is normal.                        - The distal rectum and anal verge are normal on                         retroflexion view.                        - No specimens collected. Recommendation:        - Discharge patient to home (with escort).                        - Resume previous diet today.                        - Continue present medications.                        - Repeat colonoscopy in 5 years  for screening purposes                         given family histoy of colorectal cancer in her father. Procedure Code(s):     --- Professional ---                        F4142, Colorectal cancer screening; colonoscopy on                         individual at high risk Diagnosis Code(s):     --- Professional ---                        Z80.0, Family history of malignant neoplasm of                         digestive organs CPT copyright 2019 American Medical Association. All rights reserved. The codes documented in this report are preliminary and upon coder review may  be revised to meet current compliance requirements. Dr. Ulyess Mort Lin Landsman MD, MD 09/15/2021 8:05:40 AM This report has been signed electronically. Number of Addenda: 0 Note Initiated On: 09/15/2021 7:07 AM Scope Withdrawal Time: 0 hours 6 minutes 15 seconds  Total Procedure Duration: 0 hours 9 minutes 21 seconds  Estimated Blood Loss:  Estimated blood loss: none.      [facility]

## 2021-09-15 NOTE — Transfer of Care (Signed)
Immediate Anesthesia Transfer of Care Note  Patient: Anna [name]  Procedure(s) Performed: COLONOSCOPY WITH PROPOFOL  Patient Location: PACU and Endoscopy Unit  Anesthesia Type:General  Level of Consciousness: drowsy  Airway & Oxygen Therapy: Patient Spontanous Breathing and Patient connected to face mask oxygen  Post-op Assessment: Report given to RN  Post vital signs: stable  Last Vitals:  Vitals Value Taken Time  BP    Temp    Pulse 65 09/15/21 0806  Resp 15 09/15/21 0806  SpO2 97 % 09/15/21 0806  Vitals shown include unvalidated device data.  Last Pain:  Vitals:   09/15/21 0639  TempSrc: Temporal  PainSc: 0-No pain         Complications: No notable events documented.

## 2021-09-15 NOTE — Anesthesia Preprocedure Evaluation (Signed)
Anesthesia Evaluation  Patient identified by MRN, date of birth, ID band Patient awake    Reviewed: Allergy & Precautions, NPO status , Patient's Chart, lab work & pertinent test results  Airway Mallampati: II  TM Distance: >3 FB Neck ROM: Full    Dental  (+) Teeth Intact   Pulmonary neg pulmonary ROS,    Pulmonary exam normal breath sounds clear to auscultation       Cardiovascular Exercise Tolerance: Good hypertension, Pt. on medications negative cardio ROS Normal cardiovascular exam Rhythm:Regular Rate:Normal     Neuro/Psych Anxiety Depression negative neurological ROS  negative psych ROS   GI/Hepatic negative GI ROS, Neg liver ROS,   Endo/Other  negative endocrine ROSMorbid obesity  Renal/GU negative Renal ROS  negative genitourinary   Musculoskeletal negative musculoskeletal ROS (+)   Abdominal (+) + obese,   Peds negative pediatric ROS (+)  Hematology negative hematology ROS (+)   Anesthesia Other Findings Past Medical History: No date: Hypertension  Past Surgical History: 12/04/2017: FINGER ARTHROSCOPY WITH EXTENSIVE DEBRIDEMENT; Right     Comment:  Procedure: DEBRIDEMENT WITH BONE GRAFTING OF A PRESUMED               ENCHONDROMA OF THE RIGHT LING MIDDLE PHALANX;  Surgeon:               Corky Mull, MD;  Location: [place];                Service: Orthopedics;  Laterality: Right;  CANCELLOUS               CHIPS AND DEMINERALIZED BONE  [employer] PUTTY AVAILABLE FOR               GRAFTING VERY SMALL OSTEOTOMES AND CURETTES 09/22/2017: LAPAROSCOPIC APPENDECTOMY; N/A     Comment:  Procedure: APPENDECTOMY LAPAROSCOPIC;  Surgeon: Vickie Epley, MD;  Location: [facility];  Service: General;                Laterality: N/A;  BMI    Body Mass Index: 38.59 kg/m      Reproductive/Obstetrics negative OB ROS                             Anesthesia  Physical Anesthesia Plan  ASA: 2  Anesthesia Plan: General   Post-op Pain Management:    Induction: Intravenous  PONV Risk Score and Plan: Propofol infusion and TIVA  Airway Management Planned: Natural Airway  Additional Equipment:   Intra-op Plan:   Post-operative Plan:   Informed Consent: I have reviewed the patients History and Physical, chart, labs and discussed the procedure including the risks, benefits and alternatives for the proposed anesthesia with the patient or authorized representative who has indicated his/her understanding and acceptance.     Dental Advisory Given  Plan Discussed with: CRNA and Surgeon  Anesthesia Plan Comments:         Anesthesia Quick Evaluation

## 2021-09-28 NOTE — Progress Notes (Unsigned)
I,Jana Arie Powell,acting as a Education administrator for [employer], PA-C.,have documented all relevant documentation on the behalf of Mardene Speak, PA-C,as directed by  [employer], PA-C while in the presence of [employer], PA-C.   Established patient visit   Patient: Anna [name]   DOB: 06-29-1970   51 y.o. Female  MRN: [id number] Visit Date: 09/29/2021  Today's healthcare provider: Mardene Speak, PA-C   Chief Complaint  Patient presents with   Hypertension  Follow up BP  Subjective    Hypertension, follow-up  BP Readings from Last 3 Encounters:  09/29/21 (!) 148/116  09/15/21 125/85  08/25/21 (!) 164/102   Wt Readings from Last 3 Encounters:  09/29/21 215 lb 3.2 oz (97.6 kg)  09/15/21 211 lb (95.7 kg)  08/25/21 216 lb 3.2 oz (98.1 kg)     She was last seen for hypertension 5 weeks ago.  BP at that visit was 164/102. Management since that visit includes increase Metoprolol to 100 mg and add Lisinopril.  Recommend to measure BP at home and bring log to next appt.  She reports excellent compliance with treatment. She is not having side effects.  She is following a Regular diet. She is exercising. Walking and weights  She does not smoke.  Use of agents associated with hypertension: none.   Outside blood pressures are average 120/80. Symptoms: No chest pain No chest pressure  No palpitations No syncope  No dyspnea No orthopnea  No paroxysmal nocturnal dyspnea No lower extremity edema   Pertinent labs Lab Results  Component Value Date   CHOL 229 (H) 07/18/2021   HDL 39 (L) 07/18/2021   LDLCALC 165 (H) 07/18/2021   TRIG 135 07/18/2021   CHOLHDL 5.9 (H) 07/18/2021   Lab Results  Component Value Date   NA 140 07/18/2021   K 4.1 07/18/2021   CREATININE 0.80 07/18/2021   EGFR 90 07/18/2021   GLUCOSE 102 (H) 07/18/2021   TSH 1.570 07/18/2021     The 10-year ASCVD risk score (Arnett DK, et al., 2019) is:  10.8%  ---------------------------------------------------------------------------------------------------  Follow up for Hypercholesterolemia without hypertriglyceridemia  The patient was last seen for this 5 weeks ago. Changes made at last visit include continue lifestyle modifications, advised to start OTC omega-3 daily and Red yeast rice extract with CoQ10. No Omega -3 but patient will pick up today.   She reports good compliance with treatment. She feels that condition is Improved. She is not having side effects.   -----------------------------------------------------------------------------------------  Medications: Outpatient Medications Prior to Visit  Medication Sig   lisinopril (ZESTRIL) 10 MG tablet Take 1 tablet (10 mg total) by mouth daily.   metoprolol succinate (TOPROL-XL) 100 MG 24 hr tablet Take 1 tablet (100 mg total) by mouth daily. Take with or immediately following a meal.   Vitamin D, Ergocalciferol, (DRISDOL) 1.25 MG (50000 UNIT) CAPS capsule Take 1 capsule (50,000 Units total) by mouth every 7 (seven) days.   No facility-administered medications prior to visit.    Review of Systems  {Labs  Heme  Chem  Endocrine  Serology  Results Review (optional):23779}   Objective    BP (!) 148/116 (BP Location: Right Arm, Patient Position: Sitting, Cuff Size: Normal)   Pulse 77   Temp 98.1 F (36.7 C) (Oral)   Resp 16   Wt 215 lb 3.2 oz (97.6 kg)   LMP 03/19/2017 (Approximate) Comment: Preg Test Negative  SpO2 100%   BMI 39.36 kg/m  {Show previous vital signs (optional):23777}  Physical Exam  ***  No results found for any visits on 09/29/21.  Assessment & Plan     ***  No follow-ups on file.      {provider attestation***:1}   Mardene Speak, Hershal Coria  [facility] [phone] (phone) [phone] (fax)  [place]

## 2021-09-29 ENCOUNTER — Encounter: Payer: Self-pay | Admitting: Physician Assistant

## 2021-09-29 ENCOUNTER — Ambulatory Visit: Payer: Managed Care, Other (non HMO) | Admitting: Physician Assistant

## 2021-09-29 VITALS — BP 148/116 | HR 77 | Temp 98.1°F | Resp 16 | Wt 215.2 lb

## 2021-09-29 DIAGNOSIS — Z6838 Body mass index (BMI) 38.0-38.9, adult: Secondary | ICD-10-CM | POA: Diagnosis not present

## 2021-09-29 DIAGNOSIS — I10 Essential (primary) hypertension: Secondary | ICD-10-CM | POA: Diagnosis not present

## 2021-09-29 DIAGNOSIS — E7800 Pure hypercholesterolemia, unspecified: Secondary | ICD-10-CM

## 2021-09-29 MED ORDER — METOPROLOL SUCCINATE ER 25 MG PO TB24: 25.0000 mg | ORAL_TABLET | Freq: Every day | ORAL | 3 refills | Status: DC

## 2021-10-13 ENCOUNTER — Ambulatory Visit: Payer: Managed Care, Other (non HMO) | Admitting: Physician Assistant

## 2021-10-19 NOTE — Progress Notes (Deleted)
      Established patient visit   Patient: Anna [name]   DOB: 27-May-1970   51 y.o. Female  MRN: [id number] Visit Date: 10/20/2021  Today's healthcare provider: Mardene Speak, PA-C   No chief complaint on file.  Subjective    Hypertension, follow-up  BP Readings from Last 3 Encounters:  09/29/21 (!) 148/116  09/15/21 125/85  08/25/21 (!) 164/102   Wt Readings from Last 3 Encounters:  09/29/21 215 lb 3.2 oz (97.6 kg)  09/15/21 211 lb (95.7 kg)  08/25/21 216 lb 3.2 oz (98.1 kg)     She was last seen for hypertension 2 weeks ago.  BP at that visit was 148/116. Management since that visit includes start Metoprolol 124 mg and increase Lisinopril to 20 mg. Adhere to low-diet diet and regular exercise.   She reports {excellent/good/fair/poor:19665} compliance with treatment. She {is/is not:9024} having side effects. {document side effects if present:1} She is following a {diet:21022986} diet. She {is/is not:9024} exercising. She {does/does not:200015} smoke.  Use of agents associated with hypertension: {bp agents assoc with hypertension:511::"none"}.   Outside blood pressures are {***enter patient reported home BP readings, or 'not being checked':1}. Symptoms: {Yes/No:20286} chest pain {Yes/No:20286} chest pressure  {Yes/No:20286} palpitations {Yes/No:20286} syncope  {Yes/No:20286} dyspnea {Yes/No:20286} orthopnea  {Yes/No:20286} paroxysmal nocturnal dyspnea {Yes/No:20286} lower extremity edema   Pertinent labs Lab Results  Component Value Date   CHOL 229 (H) 07/18/2021   HDL 39 (L) 07/18/2021   LDLCALC 165 (H) 07/18/2021   TRIG 135 07/18/2021   CHOLHDL 5.9 (H) 07/18/2021   Lab Results  Component Value Date   NA 140 07/18/2021   K 4.1 07/18/2021   CREATININE 0.80 07/18/2021   EGFR 90 07/18/2021   GLUCOSE 102 (H) 07/18/2021   TSH 1.570 07/18/2021     The 10-year ASCVD risk score (Arnett DK, et al., 2019) is:  10.8%  ---------------------------------------------------------------------------------------------------   Medications: Outpatient Medications Prior to Visit  Medication Sig   lisinopril (ZESTRIL) 10 MG tablet Take 1 tablet (10 mg total) by mouth daily.   metoprolol succinate (TOPROL-XL) 100 MG 24 hr tablet Take 1 tablet (100 mg total) by mouth daily. Take with or immediately following a meal.   metoprolol succinate (TOPROL-XL) 25 MG 24 hr tablet Take 1 tablet (25 mg total) by mouth daily.   Vitamin D, Ergocalciferol, (DRISDOL) 1.25 MG (50000 UNIT) CAPS capsule Take 1 capsule (50,000 Units total) by mouth every 7 (seven) days.   No facility-administered medications prior to visit.    Review of Systems  {Labs  Heme  Chem  Endocrine  Serology  Results Review (optional):23779}   Objective    LMP 03/19/2017 (Approximate) Comment: Preg Test Negative {Show previous vital signs (optional):23777}  Physical Exam  ***  No results found for any visits on 10/20/21.  Assessment & Plan     ***  No follow-ups on file.      {provider attestation***:1}   Mardene Speak, Hershal Coria  [facility] [phone] (phone) [phone] (fax)  [place]

## 2021-10-20 ENCOUNTER — Ambulatory Visit: Payer: Managed Care, Other (non HMO) | Admitting: Physician Assistant

## 2021-10-20 DIAGNOSIS — E7800 Pure hypercholesterolemia, unspecified: Secondary | ICD-10-CM

## 2021-10-20 DIAGNOSIS — I10 Essential (primary) hypertension: Secondary | ICD-10-CM

## 2021-11-09 NOTE — Progress Notes (Deleted)
     I,Jana Parrish Bonn,acting as a Education administrator for [employer], PA-C.,have documented all relevant documentation on the behalf of Mardene Speak, PA-C,as directed by  [employer], PA-C while in the presence of [employer], PA-C.   Established patient visit   Patient: Anna [name]   DOB: [birth year]   52 y.o. Female  MRN: [id number] Visit Date: 11/10/2021  Today's healthcare provider: Mardene Speak, PA-C   No chief complaint on file.  Subjective    Hypertension, follow-up  BP Readings from Last 3 Encounters:  09/29/21 (!) 148/116  09/15/21 125/85  08/25/21 (!) 164/102   Wt Readings from Last 3 Encounters:  09/29/21 215 lb 3.2 oz (97.6 kg)  09/15/21 211 lb (95.7 kg)  08/25/21 216 lb 3.2 oz (98.1 kg)     She was last seen for hypertension 6 weeks ago.  BP at that visit was 148/116. Management since that visit includes: Pt was advised to start on 125m of metoprolol and increase lisinopril to 216mdaily Emphasize the need for BP control. Pt planned to order a new BP cuff to measure her BP at home - metoprolol succinate (TOPROL-XL) 25 MG 24 hr tablet; Take 1 tablet (25 mg total) by mouth daily.  - Advise to adhere to low-salt diet and regular exercise    She reports {excellent/good/fair/poor:19665} compliance with treatment. She {is/is not:9024} having side effects. She is following a {diet:21022986} diet. She {is/is not:9024} exercising. She {does/does not:200015} smoke.  Use of agents associated with hypertension: {bp agents assoc with hypertension:511::"none"}.   Outside blood pressures are  Symptoms: {Yes/No:20286} chest pain {Yes/No:20286} chest pressure  {Yes/No:20286} palpitations {Yes/No:20286} syncope  {Yes/No:20286} dyspnea {Yes/No:20286} orthopnea  {Yes/No:20286} paroxysmal nocturnal dyspnea {Yes/No:20286} lower extremity edema   Pertinent labs Lab Results  Component Value Date   CHOL 229 (H) 07/18/2021   HDL 39 (L) 07/18/2021   LDLCALC 165 (H) 07/18/2021    TRIG 135 07/18/2021   CHOLHDL 5.9 (H) 07/18/2021   Lab Results  Component Value Date   NA 140 07/18/2021   K 4.1 07/18/2021   CREATININE 0.80 07/18/2021   EGFR 90 07/18/2021   GLUCOSE 102 (H) 07/18/2021   TSH 1.570 07/18/2021     The 10-year ASCVD risk score (Arnett DK, et al., 2019) is: 10.8%  ---------------------------------------------------------------------------------------------------   Medications: Outpatient Medications Prior to Visit  Medication Sig   lisinopril (ZESTRIL) 10 MG tablet Take 1 tablet (10 mg total) by mouth daily.   metoprolol succinate (TOPROL-XL) 100 MG 24 hr tablet Take 1 tablet (100 mg total) by mouth daily. Take with or immediately following a meal.   metoprolol succinate (TOPROL-XL) 25 MG 24 hr tablet Take 1 tablet (25 mg total) by mouth daily.   Vitamin D, Ergocalciferol, (DRISDOL) 1.25 MG (50000 UNIT) CAPS capsule Take 1 capsule (50,000 Units total) by mouth every 7 (seven) days.   No facility-administered medications prior to visit.    Review of Systems  {Labs  Heme  Chem  Endocrine  Serology  Results Review (optional):23779}   Objective    LMP 03/19/2017 (Approximate) Comment: Preg Test Negative {Show previous vital signs (optional):23777}  Physical Exam  ***  No results found for any visits on 11/10/21.  Assessment & Plan     ***  No follow-ups on file.      {provider attestation***:1}   JaMardene SpeakPAHershal CoriaBuKaiser Fnd Hosp - Redwood [phone]) [phone])  [place]

## 2021-11-10 ENCOUNTER — Ambulatory Visit: Payer: Managed Care, Other (non HMO) | Admitting: Physician Assistant

## 2021-11-10 DIAGNOSIS — I10 Essential (primary) hypertension: Secondary | ICD-10-CM

## 2021-11-10 DIAGNOSIS — E7800 Pure hypercholesterolemia, unspecified: Secondary | ICD-10-CM

## 2021-11-14 ENCOUNTER — Encounter: Payer: Self-pay | Admitting: Physician Assistant

## 2021-11-14 ENCOUNTER — Ambulatory Visit: Payer: Managed Care, Other (non HMO) | Admitting: Physician Assistant

## 2021-11-14 ENCOUNTER — Emergency Department
Admission: EM | Admit: 2021-11-14 | Discharge: 2021-11-14 | Disposition: A | Discharge status: Home / Self Care | Payer: Managed Care, Other (non HMO) | Attending: Emergency Medicine | Admitting: Emergency Medicine

## 2021-11-14 ENCOUNTER — Other Ambulatory Visit: Payer: Self-pay

## 2021-11-14 VITALS — BP 180/97 | HR 68 | Ht 62.0 in | Wt 216.5 lb

## 2021-11-14 DIAGNOSIS — F419 Anxiety disorder, unspecified: Secondary | ICD-10-CM | POA: Diagnosis not present

## 2021-11-14 DIAGNOSIS — I471 Supraventricular tachycardia: Secondary | ICD-10-CM

## 2021-11-14 DIAGNOSIS — I10 Essential (primary) hypertension: Secondary | ICD-10-CM

## 2021-11-14 DIAGNOSIS — R009 Unspecified abnormalities of heart beat: Secondary | ICD-10-CM | POA: Diagnosis not present

## 2021-11-14 LAB — COMPREHENSIVE METABOLIC PANEL
ALT: 22 U/L (ref 0–44)
AST: 19 U/L (ref 15–41)
Albumin: 4.2 g/dL (ref 3.5–5.0)
Alkaline Phosphatase: 99 U/L (ref 38–126)
Anion gap: 11 (ref 5–15)
BUN: 15 mg/dL (ref 6–20)
CO2: 26 mmol/L (ref 22–32)
Calcium: 9.4 mg/dL (ref 8.9–10.3)
Chloride: 103 mmol/L (ref 98–111)
Creatinine, Ser: 0.78 mg/dL (ref 0.44–1.00)
GFR, Estimated: >60 mL/min (ref >60)
Glucose, Bld: 95 mg/dL (ref 70–99)
Potassium: 3.6 mmol/L (ref 3.5–5.1)
Sodium: 140 mmol/L (ref 135–145)
Total Bilirubin: 0.7 mg/dL (ref 0.3–1.2)
Total Protein: 8.5 g/dL — ABNORMAL HIGH (ref 6.5–8.1)

## 2021-11-14 LAB — CBC WITH DIFFERENTIAL/PLATELET
Abs Immature Granulocytes: 0.02 10*3/uL (ref 0.00–0.07)
Basophils Absolute: 0 10*3/uL (ref 0.0–0.1)
Basophils Relative: 1 %
Eosinophils Absolute: 0.2 10*3/uL (ref 0.0–0.5)
Eosinophils Relative: 4 %
HCT: 44.2 % (ref 36.0–46.0)
Hemoglobin: 14.1 g/dL (ref 12.0–15.0)
Immature Granulocytes: 0 %
Lymphocytes Relative: 49 %
Lymphs Abs: 2.9 10*3/uL (ref 0.7–4.0)
MCH: 28.5 pg (ref 26.0–34.0)
MCHC: 31.9 g/dL (ref 30.0–36.0)
MCV: 89.5 fL (ref 80.0–100.0)
Monocytes Absolute: 0.5 10*3/uL (ref 0.1–1.0)
Monocytes Relative: 9 %
Neutro Abs: 2.1 10*3/uL (ref 1.7–7.7)
Neutrophils Relative %: 37 %
Platelets: 284 10*3/uL (ref 150–400)
RBC: 4.94 MIL/uL (ref 3.87–5.11)
RDW: 13.4 % (ref 11.5–15.5)
WBC: 5.7 10*3/uL (ref 4.0–10.5)
nRBC: 0 % (ref 0.0–0.2)

## 2021-11-14 MED ORDER — LISINOPRIL-HYDROCHLOROTHIAZIDE 20-12.5 MG PO TABS: 1.0000 | ORAL_TABLET | Freq: Every day | ORAL | 3 refills | Status: DC

## 2021-11-14 MED ORDER — CITALOPRAM HYDROBROMIDE 10 MG PO TABS: 10.0000 mg | ORAL_TABLET | Freq: Every day | ORAL | 3 refills | Status: DC

## 2021-11-14 MED ORDER — AMLODIPINE BESYLATE 5 MG PO TABS: 5.0000 mg | ORAL_TABLET | Freq: Every day | ORAL | 0 refills | Status: DC

## 2021-11-14 NOTE — ED Triage Notes (Signed)
Pt was seen at primary care for high blood pressure. She has been taking her blood pressure medicine as ordered. Pt PCP sent her here and mentioned she may start diuretic.

## 2021-11-14 NOTE — ED Provider Notes (Signed)
[facility] Provider Note   Event Date/Time   First MD Initiated Contact with Patient 11/14/21 1738     (approximate) History  Hypertension  HPI SEAN MACWILLIAMS is a 51 y.o. female with a past medical history of primary hypertension who presents for a hypotensive episode during being seen at a clinic visit today in which her systolic blood pressure was in the 200s and was sent for further evaluation.  Patient denies any flank pain, headaches ROS: Patient currently denies any vision changes, tinnitus, difficulty speaking, facial droop, sore throat, chest pain, shortness of breath, abdominal pain, nausea/vomiting/diarrhea, dysuria, or weakness/numbness/paresthesias in any extremity   Physical Exam  Triage Vital Signs: ED Triage Vitals  Enc Vitals Group     BP 11/14/21 1638 (!) 219/111     Pulse Rate 11/14/21 1638 64     Resp 11/14/21 1638 17     Temp 11/14/21 1638 97.8 F (36.6 C)     Temp Source 11/14/21 1638 Oral     SpO2 11/14/21 1638 100 %     Weight 11/14/21 1639 216 lb (98 kg)     Height 11/14/21 1639 '5\' 2"'$  (1.575 m)     Head Circumference --      Peak Flow --      Pain Score 11/14/21 1639 0     Pain Loc --      Pain Edu? --      Excl. in [place]? --    Most recent vital signs: Vitals:   11/14/21 1817 11/14/21 1817  BP:  (!) 181/100  Pulse: (!) 59   Resp:    Temp:    SpO2: 96%    General: Awake, oriented x4. CV:  Good peripheral perfusion.  Resp:  Normal effort.  Abd:  No distention.  Other:  Middle-aged obese Hispanic female laying in bed in no acute distress ED Results / Procedures / Treatments  Labs (all labs ordered are listed, but only abnormal results are displayed) Labs Reviewed  COMPREHENSIVE METABOLIC PANEL - Abnormal; Notable for the following components:      Result Value   Total Protein 8.5 (*)    All other components within normal limits  CBC WITH DIFFERENTIAL/PLATELET   EKG ED ECG REPORT I, Naaman Plummer, the attending  physician, personally viewed and interpreted this ECG. Date: 11/14/2021 EKG Time: 1640 Rate: 65 Rhythm: normal sinus rhythm QRS Axis: normal Intervals: normal ST/T Wave abnormalities: normal Narrative Interpretation: no evidence of acute ischemia PROCEDURES: Critical Care performed: No .1-3 Lead EKG Interpretation  Performed by: Naaman Plummer, MD Authorized by: Naaman Plummer, MD    MEDICATIONS ORDERED IN ED: Medications - No data to display IMPRESSION / MDM / [place] / ED COURSE  I reviewed the triage vital signs and the nursing notes.                             The patient is on the cardiac monitor to evaluate for evidence of arrhythmia and/or significant heart rate changes. Patient's presentation is most consistent with acute presentation with potential threat to life or bodily function. Presents to the emergency department complaining of high blood pressure. Patient is otherwise asymptomatic without confusion, chest pain, hematuria, or SOB. Denies nonadherence to antihypertensive regimen DDx: CV, AMI, heart failure, renal infarction or failure or other end organ damage.  Disposition: Discussed with patient their elevated blood pressure and need for close  outpatient management of their hypertension. Will provide a prescription for the patients previous antihypertensive medication and arrange for the patient to follow up in a primary care clinic    FINAL CLINICAL IMPRESSION(S) / ED DIAGNOSES   Final diagnoses:  Primary hypertension   Rx / DC Orders   ED Discharge Orders          Ordered    lisinopril-hydrochlorothiazide (ZESTORETIC) 20-12.5 MG tablet  Daily        11/14/21 1809           Note:  This document was prepared using Dragon voice recognition software and may include unintentional dictation errors.   Naaman Plummer, MD 11/14/21 2130

## 2021-11-14 NOTE — ED Notes (Addendum)
Pt denies dizziness, headache and weakness. Pt's speech clear. Pt reports follows with PCP for BP. Pt in NAD. Pt educated on s/s of stroke and what to do if s/s occur. Pt confirms will be following up with PCP this week.

## 2021-11-14 NOTE — Progress Notes (Signed)
     I,Sha'taria Tyson,acting as a Education administrator for [employer], PA-C.,have documented all relevant documentation on the behalf of Mardene Speak, PA-C,as directed by  [employer], PA-C while in the presence of [employer], PA-C.   Established patient visit   Patient: Anna [name]   DOB: Jul 17, 1970   51 y.o. Female  MRN: [id number] Visit Date: 11/14/2021  Today's healthcare provider: Mardene Speak, PA-C   CC: HTN FU  Subjective    HPI  Declined flu vaccine today.  Hypertension, follow-up  BP Readings from Last 3 Encounters:  09/29/21 (!) 148/116  09/15/21 125/85  08/25/21 (!) 164/102   Wt Readings from Last 3 Encounters:  09/29/21 215 lb 3.2 oz (97.6 kg)  09/15/21 211 lb (95.7 kg)  08/25/21 216 lb 3.2 oz (98.1 kg)     She was last seen for hypertension 2 weeks ago.  BP at that visit was as above. Management since that visit includes starting patient on Metoprolol and increasing Lisinopril.  Patient was advised to adhere to a low-salt diet, regular exercise she was also asked to order a new home blood pressure monitor and to measure her blood pressure at home.    She reports excellent compliance with treatment. She is not having side effects.  She is following a Regular, decrease salt intake  diet. She is not exercising. She does not smoke.  Use of agents associated with hypertension: none.   Outside blood pressures are {167/111-152/88}. Symptoms: No chest pain No chest pressure  No palpitations No syncope  No dyspnea No orthopnea  No paroxysmal nocturnal dyspnea No lower extremity edema   Pertinent labs Lab Results  Component Value Date   CHOL 229 (H) 07/18/2021   HDL 39 (L) 07/18/2021   LDLCALC 165 (H) 07/18/2021   TRIG 135 07/18/2021   CHOLHDL 5.9 (H) 07/18/2021   Lab Results  Component Value Date   NA 140 07/18/2021   K 4.1 07/18/2021   CREATININE 0.80 07/18/2021   EGFR 90 07/18/2021   GLUCOSE 102 (H) 07/18/2021   TSH 1.570 07/18/2021     The 10-year  ASCVD risk score (Arnett DK, et al., 2019) is: 10.8%  ---------------------------------------------------------------------------------------------------   Medications: Outpatient Medications Prior to Visit  Medication Sig  . lisinopril (ZESTRIL) 10 MG tablet Take 1 tablet (10 mg total) by mouth daily.  . metoprolol succinate (TOPROL-XL) 100 MG 24 hr tablet Take 1 tablet (100 mg total) by mouth daily. Take with or immediately following a meal.  . metoprolol succinate (TOPROL-XL) 25 MG 24 hr tablet Take 1 tablet (25 mg total) by mouth daily.  . Vitamin D, Ergocalciferol, (DRISDOL) 1.25 MG (50000 UNIT) CAPS capsule Take 1 capsule (50,000 Units total) by mouth every 7 (seven) days.   No facility-administered medications prior to visit.    Review of Systems  Constitutional: Negative.   Respiratory: Negative.    Cardiovascular: Negative.   Gastrointestinal: Negative.   Except see HPI  {Labs  Heme  Chem  Endocrine  Serology  Results Review (optional):23779}   Objective    LMP 03/19/2017 (Approximate) Comment: Preg Test Negative {Show previous vital signs (optional):23777}  Physical Exam  ***  No results found for any visits on 11/14/21.  Assessment & Plan     ***  No follow-ups on file.      {provider attestation***:1}   Mardene Speak, Hershal Coria  [facility] [phone] (phone) [phone] (fax)  [place]

## 2021-11-15 ENCOUNTER — Encounter: Payer: Self-pay | Admitting: Physician Assistant

## 2021-11-15 ENCOUNTER — Ambulatory Visit: Payer: Self-pay

## 2021-11-15 NOTE — Telephone Encounter (Signed)
  Chief Complaint: medication assistance Symptoms: NA Frequency: today Pertinent Negatives: NA Disposition: '[]'$ ED /'[]'$ Urgent Care (no appt availability in office) / '[]'$ Appointment(In office/virtual)/ '[]'$  [place] [facility]/ '[x]'$ Home Care/ '[]'$ Refused Recommended Disposition /'[]'$ [place] [facility]/ '[]'$  Follow-up with PCP Additional Notes: advised pt that she is to take all 3 HTN medications and to check BP before and after taking medications for next few days until body gets adjusted to them. Pt verbalized understanding.   Summary: medication management   Pts inquiring if she should take amLODipine (NORVASC) 5 MG tablet, lisinopril-hydrochlorothiazide (ZESTORETIC) 20-12.5 MG tablet, and metoprolol succinate (TOPROL-XL) 100 MG 24 hr tablet all together   Please advise      Reason for Disposition  Caller has medicine question only, adult not sick, AND triager answers question  Answer Assessment - Initial Assessment Questions 1. NAME of MEDICINE: "What medicine(s) are you calling about?"     Lisinopril-hctz, amlodipine and metoprolol.  2. QUESTION: "What is your question?" (e.g., double dose of medicine, side effect)     Wanting to know if she is taking all of these or just lisinopril-hctz from ED 3. PRESCRIBER: "Who prescribed the medicine?" Reason: if prescribed by specialist, call should be referred to that group.     Letitia Libra and ED provider  Protocols used: Medication Question Call-A-AH

## 2021-11-20 ENCOUNTER — Other Ambulatory Visit: Payer: Self-pay | Admitting: Physician Assistant

## 2021-11-20 DIAGNOSIS — I10 Essential (primary) hypertension: Secondary | ICD-10-CM

## 2021-11-23 ENCOUNTER — Ambulatory Visit: Payer: Managed Care, Other (non HMO) | Admitting: Physician Assistant

## 2021-11-23 VITALS — BP 134/77 | HR 69 | Temp 97.5°F | Wt 211.0 lb

## 2021-11-23 DIAGNOSIS — I10 Essential (primary) hypertension: Secondary | ICD-10-CM | POA: Diagnosis not present

## 2021-11-23 NOTE — Progress Notes (Unsigned)
I,Amol Domanski Robinson,acting as a Education administrator for [employer], PA-C.,have documented all relevant documentation on the behalf of Mardene Speak, PA-C,as directed by  [employer], PA-C while in the presence of [employer], PA-C.    Established patient visit   Patient: Anna [name]   DOB: [birth year]   51 y.o. Female  MRN: [id number] Visit Date: 11/23/2021  Today's healthcare provider: Mardene Speak, PA-C   No chief complaint on file.  Subjective    Hypertension, follow-up  BP Readings from Last 3 Encounters:  11/23/21 134/77  11/14/21 (!) 181/100  11/14/21 (!) 180/97   Wt Readings from Last 3 Encounters:  11/23/21 211 lb (95.7 kg)  11/14/21 216 lb (98 kg)  11/14/21 216 lb 8 oz (98.2 kg)     She was last seen for hypertension 1 weeks ago.  BP at that visit was 180/97. Management since that visit includes: starting her on Lisinopril but then it was switched by the ER physician to Lisinopril HCTZ Pt advised to proceed to ED.  She reports good compliance with treatment. She is not having side effects.  Her home readings have been running 119-146 over 77-82 with one diastolic reading of 97 which was when she first started the new medicine.   Outside blood pressures are Symptoms: No chest pain No chest pressure  No palpitations No syncope  No dyspnea No orthopnea  No paroxysmal nocturnal dyspnea No lower extremity edema   Pertinent labs Lab Results  Component Value Date   CHOL 229 (H) 07/18/2021   HDL 39 (L) 07/18/2021   LDLCALC 165 (H) 07/18/2021   TRIG 135 07/18/2021   CHOLHDL 5.9 (H) 07/18/2021   Lab Results  Component Value Date   NA 140 11/14/2021   K 3.6 11/14/2021   CREATININE 0.78 11/14/2021   GFRNONAA >60 11/14/2021   GLUCOSE 95 11/14/2021   TSH 1.570 07/18/2021     The 10-year ASCVD risk score (Arnett DK, et al., 2019) is: 7.2% 45 minutes of cardio x 4 times a week  Stroke dad 52 Mother in 81s  HA ---------------------------------------------------------------------------------------------------   Medications: Outpatient Medications Prior to Visit  Medication Sig   amLODipine (NORVASC) 5 MG tablet Take 1 tablet (5 mg total) by mouth daily.   citalopram (CELEXA) 10 MG tablet Take 1 tablet (10 mg total) by mouth daily.   lisinopril-hydrochlorothiazide (ZESTORETIC) 20-12.5 MG tablet Take 1 tablet by mouth daily.   metoprolol succinate (TOPROL-XL) 100 MG 24 hr tablet TAKE 1 TABLET BY MOUTH DAILY. TAKE WITH OR IMMEDIATELY FOLLOWING A MEAL.   metoprolol succinate (TOPROL-XL) 25 MG 24 hr tablet Take 1 tablet (25 mg total) by mouth daily.   Vitamin D, Ergocalciferol, (DRISDOL) 1.25 MG (50000 UNIT) CAPS capsule Take 1 capsule (50,000 Units total) by mouth every 7 (seven) days.   [DISCONTINUED] lisinopril (ZESTRIL) 10 MG tablet TAKE 1 TABLET BY MOUTH EVERY DAY   No facility-administered medications prior to visit.    Review of Systems  All other systems reviewed and are negative. Except see hPI       Objective    BP 134/77 (BP Location: Left Arm, Patient Position: Sitting, Cuff Size: Large)   Pulse 69   Temp (!) 97.5 F (36.4 C) (Oral)   Wt 211 lb (95.7 kg)   LMP 03/19/2017 (Approximate) Comment: Preg Test Negative  SpO2 98%   BMI 38.59 kg/m    Physical Exam Constitutional:      General: She is not in acute distress.  Appearance: Normal appearance.  HENT:     Head: Normocephalic.     Nose: Nose normal.  Eyes:     Extraocular Movements: Extraocular movements intact.     Conjunctiva/sclera: Conjunctivae normal.  Pulmonary:     Effort: Pulmonary effort is normal. No respiratory distress.  Neurological:     Mental Status: She is alert and oriented to person, place, and time. Mental status is at baseline.  Psychiatric:        Behavior: Behavior normal.        Thought Content: Thought content normal.        Judgment: Judgment normal.       No results found for  any visits on 11/23/21.  Assessment & Plan     1. Hypertension, unspecified type Bp today was 134/77, at goal Well controlled Continue current medications Recheck metabolic panel at the next follow up   2. Morbid obesity ([place])  Weight loss of 5% of pt's current weight via healthy diet and daily exercise encouraged.    FU as scheduled.    The patient was advised to call back or seek an in-person evaluation if the symptoms worsen or if the condition fails to improve as anticipated.  I discussed the assessment and treatment plan with the patient. The patient was provided an opportunity to ask questions and all were answered. The patient agreed with the plan and demonstrated an understanding of the instructions.  The entirety of the information documented in the History of Present Illness, Review of Systems and Physical Exam were personally obtained by me. Portions of this information were initially documented by the CMA and reviewed by me for thoroughness and accuracy.  Portions of this note were created using dictation software and may contain typographical errors.    Mardene Speak, PA-C  [facility] [phone] (phone) [phone] (fax)  [place]

## 2021-12-06 ENCOUNTER — Other Ambulatory Visit: Payer: Self-pay | Admitting: Physician Assistant

## 2021-12-06 DIAGNOSIS — F419 Anxiety disorder, unspecified: Secondary | ICD-10-CM

## 2021-12-07 NOTE — Telephone Encounter (Signed)
Requested medication (s) are due for refill today: no  Requested medication (s) are on the active medication list: yes  Last refill:  11/14/21 #30 2 RF  Future visit scheduled: no  Notes to clinic:  pharmacy requesting 90 days prescription   Requested Prescriptions  Pending Prescriptions Disp Refills   citalopram (CELEXA) 10 MG tablet [Pharmacy Med Name: CITALOPRAM HBR 10 MG TABLET] 90 tablet 2    Sig: TAKE 1 [place]     Psychiatry:  Antidepressants - SSRI Passed - 12/06/2021  1:30 PM      Passed - Completed PHQ-2 or PHQ-9 in the last 360 days      Passed - Valid encounter within last 6 months    Recent Outpatient Visits           2 weeks ago Hypertension, unspecified type   [facility] [place], [place], PA-C   3 weeks ago Abnormal heart rate   [employer], [place], PA-C   2 months ago Essential hypertension   [employer] [employer], [place], PA-C   3 months ago Borderline diabetes   [employer], [place], PA-C   4 months ago Essential hypertension   [employer], [place], [place]

## 2021-12-26 ENCOUNTER — Telehealth: Payer: Self-pay

## 2021-12-26 ENCOUNTER — Ambulatory Visit: Payer: Self-pay | Admitting: *Deleted

## 2021-12-26 NOTE — Telephone Encounter (Signed)
Copied from [place] [phone] [id number]. Topic: Referral - Request for Referral >> Dec 26, 2021  3:12 PM Sabas Sous wrote: Has patient seen PCP for this complaint? Yes.   *If NO, is insurance requiring patient see PCP for this issue before PCP can refer them? Referral for which specialty: GI  Preferred provider/office: [facility]  Reason for referral: She went last year, stomach issues for 2 years, she has IBS/ stomach pains. Transferred to triage.

## 2021-12-26 NOTE — Telephone Encounter (Signed)
Reason for Disposition  Abdominal pain is a chronic symptom (recurrent or ongoing AND present > 4 weeks)  Answer Assessment - Initial Assessment Questions 1. LOCATION: "Where does it hurt?"      Center above the umbilicus  2. RADIATION: "Does the pain shoot anywhere else?" (e.g., chest, back)     no 3. ONSET: "When did the pain begin?" (e.g., minutes, hours or days ago)      Off/on 2 years 4. SUDDEN: "Gradual or sudden onset?"     Hunger causes, bathroom 5. PATTERN "Does the pain come and go, or is it constant?"    - If it comes and goes: "How long does it last?" "Do you have pain now?"     (Note: Comes and goes means the pain is intermittent. It goes away completely between bouts.)    - If constant: "Is it getting better, staying the same, or getting worse?"      (Note: Constant means the pain never goes away completely; most serious pain is constant and gets worse.)      Comes and goes- 1 hours- started last night- still having dull ache 6. SEVERITY: "How bad is the pain?"  (e.g., Scale 1-10; mild, moderate, or severe)    - MILD (1-3): Doesn't interfere with normal activities, abdomen soft and not tender to touch..     - MODERATE (4-7): Interferes with normal activities or awakens from sleep, abdomen tender to touch.     - SEVERE (8-10): Excruciating pain, doubled over, unable to do any normal activities.       Mild now 7. RECURRENT SYMPTOM: "Have you ever had this type of stomach pain before?" If Yes, ask: "When was the last time?" and "What happened that time?"      Ongoing- has been seen in the past- impaction- helped cleaning bowels, thin stools 8. AGGRAVATING FACTORS: "Does anything seem to cause this pain?" (e.g., foods, stress, alcohol)     unsure 9. CARDIAC SYMPTOMS: "Do you have any of the following symptoms: chest pain, difficulty breathing, sweating, nausea?"     Nausea only 10. OTHER SYMPTOMS: "Do you have any other symptoms?" (e.g., back pain, diarrhea, fever, urination  pain, vomiting)       no 11. PREGNANCY: "Is there any chance you are pregnant?" "When was your last menstrual period?"  Protocols used: Abdominal Pain - Upper-A-AH

## 2021-12-26 NOTE — Telephone Encounter (Signed)
Please advise 

## 2021-12-26 NOTE — Telephone Encounter (Signed)
  Chief Complaint: chronic abdominal pain Symptoms: pain when hungry, pain when has to move bowels Frequency: 2 years Pertinent Negatives: Patient denies back pain, diarrhea, fever, urination pain, vomiting Disposition: '[]'$ ED /'[]'$ Urgent Care (no appt availability in office) / '[x]'$ Appointment(In office/virtual)/ '[]'$  [place] [facility]/ '[]'$ Home Care/ '[]'$ Refused Recommended Disposition /'[]'$ [place] [facility]/ '[]'$  Follow-up with PCP Additional Notes: Patient called to request referral to GI but has not ever discussed this problem with provider

## 2021-12-28 ENCOUNTER — Encounter: Payer: Self-pay | Admitting: Physician Assistant

## 2021-12-28 ENCOUNTER — Ambulatory Visit: Payer: Managed Care, Other (non HMO) | Admitting: Physician Assistant

## 2021-12-28 VITALS — BP 133/80 | HR 72 | Resp 16 | Wt 211.0 lb

## 2021-12-28 DIAGNOSIS — R1084 Generalized abdominal pain: Secondary | ICD-10-CM

## 2021-12-28 DIAGNOSIS — R1013 Epigastric pain: Secondary | ICD-10-CM | POA: Diagnosis not present

## 2021-12-28 DIAGNOSIS — I10 Essential (primary) hypertension: Secondary | ICD-10-CM

## 2021-12-28 DIAGNOSIS — K2100 Gastro-esophageal reflux disease with esophagitis, without bleeding: Secondary | ICD-10-CM

## 2021-12-28 LAB — POCT URINALYSIS DIPSTICK
Bilirubin, UA: NEGATIVE
Blood, UA: NEGATIVE
Glucose, UA: NEGATIVE
Ketones, UA: NEGATIVE
Leukocytes, UA: NEGATIVE
Nitrite, UA: NEGATIVE
Protein, UA: NEGATIVE
Spec Grav, UA: 1.01 (ref 1.010–1.025)
Urobilinogen, UA: 0.2 E.U./dL
pH, UA: 6 (ref 5.0–8.0)

## 2021-12-28 NOTE — Progress Notes (Signed)
I,April Miller,acting as a Education administrator for [employer], PA-C.,have documented all relevant documentation on the behalf of Mardene Speak, PA-C,as directed by  [employer], PA-C while in the presence of [employer], PA-C.   Established patient visit   Patient: Anna [name]   DOB: 1970/07/31   51 y.o. Female  MRN: [id number] Visit Date: 12/28/2021  Today's healthcare provider: Mardene Speak, PA-C   Chief Complaint  Patient presents with   Abdominal Pain   Subjective    HPI  Patient has had stomach issues for 2 years. Symptoms of IBS, abdominal pain. There was a message where patient wanted a referral to GI. Patient has no black stools, no blood, and no rectal pain.  Lower abdominal pain On and off abdominal pain, was diagnosed with IBS  Did two colonics after CT results from 02/2021 were posted Denies nausea, vomiting  Epigastric pain "Hungry "pain before meals. Better with meals. Denies frequently taking NSAIDs  HTN Takes metoprolol,zestoretic, amlodipine BP has been stable  Medications: Outpatient Medications Prior to Visit  Medication Sig   amLODipine (NORVASC) 5 MG tablet Take 1 tablet (5 mg total) by mouth daily.   citalopram (CELEXA) 10 MG tablet TAKE 1 TABLET BY MOUTH EVERY DAY   lisinopril-hydrochlorothiazide (ZESTORETIC) 20-12.5 MG tablet Take 1 tablet by mouth daily.   metoprolol succinate (TOPROL-XL) 100 MG 24 hr tablet TAKE 1 TABLET BY MOUTH DAILY. TAKE WITH OR IMMEDIATELY FOLLOWING A MEAL.   metoprolol succinate (TOPROL-XL) 25 MG 24 hr tablet Take 1 tablet (25 mg total) by mouth daily.   Vitamin D, Ergocalciferol, (DRISDOL) 1.25 MG (50000 UNIT) CAPS capsule Take 1 capsule (50,000 Units total) by mouth every 7 (seven) days.   No facility-administered medications prior to visit.    Review of Systems  Constitutional:  Negative for appetite change, chills, fatigue and fever.  Respiratory:  Negative for chest tightness and shortness of breath.    Cardiovascular:  Negative for chest pain and palpitations.  Gastrointestinal:  Positive for abdominal pain. Negative for nausea and vomiting.  Neurological:  Negative for dizziness and weakness.       Objective    BP 133/80 (BP Location: Right Arm, Patient Position: Sitting, Cuff Size: Large)   Pulse 72   Resp 16   Wt 211 lb (95.7 kg)   LMP 03/19/2017 (Approximate) Comment: Preg Test Negative  SpO2 100%   BMI 38.59 kg/m    Physical Exam Vitals reviewed.  Constitutional:      General: She is not in acute distress.    Appearance: Normal appearance. She is well-developed. She is not diaphoretic.  HENT:     Head: Normocephalic and atraumatic.  Eyes:     General: No scleral icterus.    Conjunctiva/sclera: Conjunctivae normal.  Neck:     Thyroid: No thyromegaly.  Cardiovascular:     Rate and Rhythm: Normal rate and regular rhythm.     Pulses: Normal pulses.     Heart sounds: Normal heart sounds. No murmur heard. Pulmonary:     Effort: Pulmonary effort is normal. No respiratory distress.     Breath sounds: Normal breath sounds. No wheezing, rhonchi or rales.  Abdominal:     General: Bowel sounds are normal.     Tenderness: There is abdominal tenderness in the right lower quadrant, epigastric area and left lower quadrant.  Musculoskeletal:     Cervical back: Neck supple.     Right lower leg: No edema.     Left lower  leg: No edema.  Lymphadenopathy:     Cervical: No cervical adenopathy.  Skin:    General: Skin is warm and dry.     Findings: No rash.  Neurological:     General: No focal deficit present.     Mental Status: She is alert and oriented to person, place, and time. Mental status is at baseline.  Psychiatric:        Mood and Affect: Mood is anxious.        Behavior: Behavior normal.      No results found for any visits on 12/28/21.  Assessment & Plan     1. Gastroesophageal reflux disease with esophagitis without hemorrhage Could be a flare up -  Lipase - Ambulatory referral to Gastroenterology - H. pylori breath test Might need a trial of PPI after lab collection Start PPI/if >2 episodes/week Elevate the head of the bed 6-8 inches, avoid recumbency for 3 hours after eating, avoid food as a delayed gastric emptying, weight loss    2. Epigastric pain Could be due to GERD, Gastritis, PUD - Lipase - Ambulatory referral to Gastroenterology - H. pylori breath test Family hx is positive for pancreatic cancer  3. Generalized abdominal pain/lower abdominal pain Could be due to IBS/abd pain associated with altered defecation/bowel habits. Pain relieved with defecation - POCT urinalysis dipstick Low fat, high fiber and unprocessed food diet advised Advised to Avoid beverages containing sorbitol or fructose, avoid gas-producing foods. Recommended a good-quality Sleep and daily exercise.  4. Hypertension, unspecified type Chronic and stable Continue current med regimen  Advised an adherence to low-salt diet and daily exercise  5. Morbid obesity ([place]) Weight loss of 5% of pt's current weight via healthy diet and daily exercise encouraged.   FU after GI appt    The patient was advised to call back or seek an in-person evaluation if the symptoms worsen or if the condition fails to improve as anticipated.  I discussed the assessment and treatment plan with the patient. The patient was provided an opportunity to ask questions and all were answered. The patient agreed with the plan and demonstrated an understanding of the instructions.  The entirety of the information documented in the History of Present Illness, Review of Systems and Physical Exam were personally obtained by me. Portions of this information were initially documented by the CMA and reviewed by me for thoroughness and accuracy.  Portions of this note were created using dictation software and may contain typographical errors.        Total encounter time more than 30  minutes  Greater than 50% was spent in counseling and coordination of care with the patient  Elberta Leatherwood  [facility] [phone] (phone) [phone] (fax)  [place]

## 2021-12-28 NOTE — Telephone Encounter (Signed)
Patient has an appt with provider this afternoon.

## 2021-12-30 LAB — LIPASE: Lipase: 24 U/L (ref 14–72)

## 2021-12-30 LAB — H. PYLORI BREATH TEST: H pylori Breath Test: NEGATIVE

## 2022-02-01 ENCOUNTER — Other Ambulatory Visit: Payer: Self-pay | Admitting: Physician Assistant

## 2022-02-01 DIAGNOSIS — I10 Essential (primary) hypertension: Secondary | ICD-10-CM

## 2022-02-01 NOTE — Telephone Encounter (Signed)
Requested Prescriptions  Pending Prescriptions Disp Refills   amLODipine (NORVASC) 5 MG tablet [Pharmacy Med Name: AMLODIPINE BESYLATE 5 MG TAB] 30 tablet 2    Sig: TAKE 1 TABLET (5 MG TOTAL) BY MOUTH DAILY.     Cardiovascular: Calcium Channel Blockers 2 Passed - 02/01/2022  2:20 AM      Passed - Last BP in normal range    BP Readings from Last 1 Encounters:  12/28/21 133/80         Passed - Last Heart Rate in normal range    Pulse Readings from Last 1 Encounters:  12/28/21 72         Passed - Valid encounter within last 6 months    Recent Outpatient Visits           1 month ago Gastroesophageal reflux disease with esophagitis without hemorrhage   [facility] [place], [place], PA-C   2 months ago Hypertension, unspecified type   [facility] [place], [place], PA-C   2 months ago Abnormal heart rate   [employer], [place], PA-C   4 months ago Essential hypertension   [employer] [employer], [place], PA-C   5 months ago Borderline diabetes   [employer], [place], [place]

## 2022-02-13 ENCOUNTER — Other Ambulatory Visit: Payer: Self-pay | Admitting: Physician Assistant

## 2022-02-13 DIAGNOSIS — I10 Essential (primary) hypertension: Secondary | ICD-10-CM

## 2022-02-14 NOTE — Telephone Encounter (Signed)
Requested Prescriptions  Pending Prescriptions Disp Refills   metoprolol succinate (TOPROL-XL) 100 MG 24 hr tablet [Pharmacy Med Name: METOPROLOL SUCC ER 100 MG TAB] 90 tablet 1    Sig: TAKE 1 TABLET BY MOUTH EVERY DAY WITH OR IMMEDIATELY FOLLOWING A MEAL     Cardiovascular:  Beta Blockers Passed - 02/13/2022  1:30 PM      Passed - Last BP in normal range    BP Readings from Last 1 Encounters:  12/28/21 133/80         Passed - Last Heart Rate in normal range    Pulse Readings from Last 1 Encounters:  12/28/21 72         Passed - Valid encounter within last 6 months    Recent Outpatient Visits           1 month ago Gastroesophageal reflux disease with esophagitis without hemorrhage   [facility] [place], [place], PA-C   2 months ago Hypertension, unspecified type   [employer] [employer], [place], PA-C   3 months ago Abnormal heart rate   [employer], [place], PA-C   4 months ago Essential hypertension   [employer], [place], PA-C   5 months ago Borderline diabetes   [employer], [place], [place]

## 2022-02-17 ENCOUNTER — Other Ambulatory Visit: Payer: Self-pay | Admitting: Physician Assistant

## 2022-02-17 DIAGNOSIS — I10 Essential (primary) hypertension: Secondary | ICD-10-CM

## 2022-03-28 IMAGING — CT CT ABD-PELV W/ CM
2 of 5 series · 16 of 46 positions shown, 18 images · IV contrast (APPLIED)
Comparison: September 21, 2017

CLINICAL DATA: Chronic intermittent mid abdominal pain.

EXAM:
CT ABDOMEN AND PELVIS WITH CONTRAST
TECHNIQUE: Multidetector CT imaging of the abdomen and pelvis was performed
using the standard protocol following bolus administration of
intravenous contrast.
CONTRAST:  100mL OMNIPAQUE IOHEXOL 300 MG/ML  SOLN

[Series 2: routine abd/pel with · axial · 0.82mm/px · z∈[-749,-309]mm · 13 of 100 slices shown, 15 images]
[im 6/100  soft-tissue]
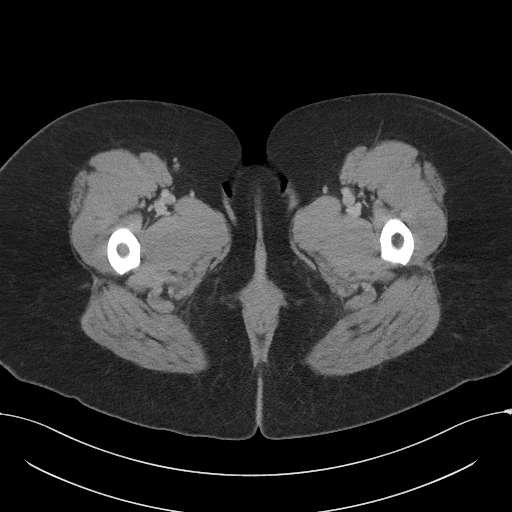
[im 6/100  bone]
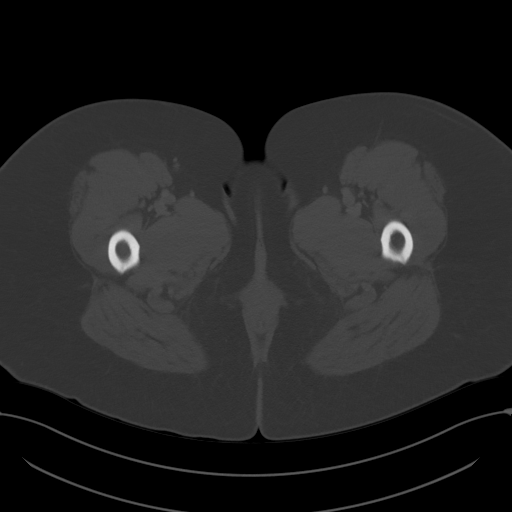
[im 12/100  soft-tissue]
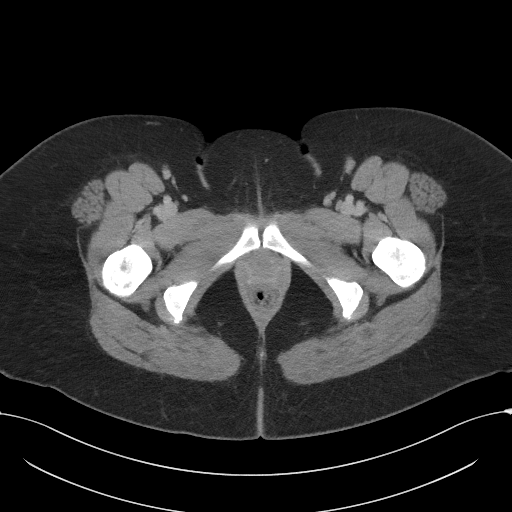
[im 23/100  soft-tissue]
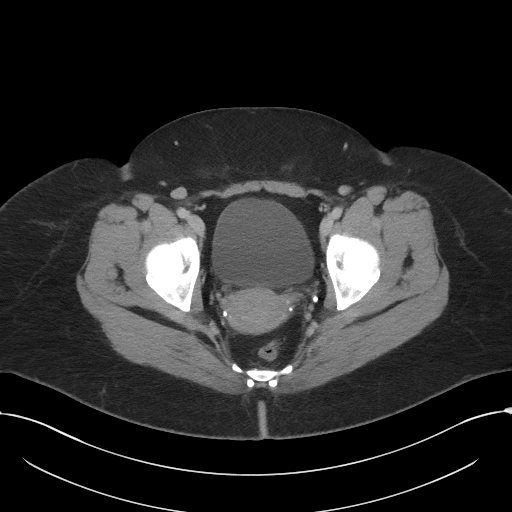
[im 28/100  soft-tissue]
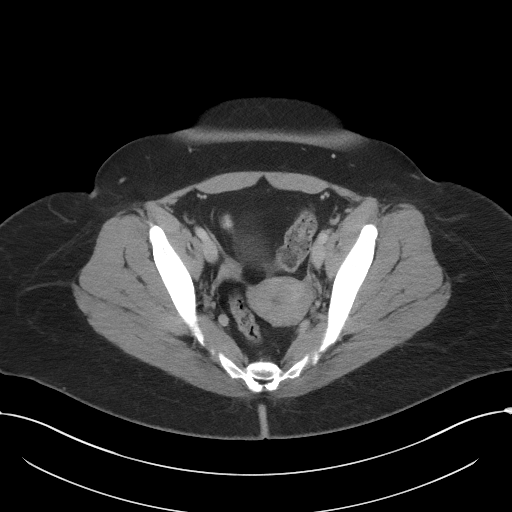
[im 34/100  soft-tissue]
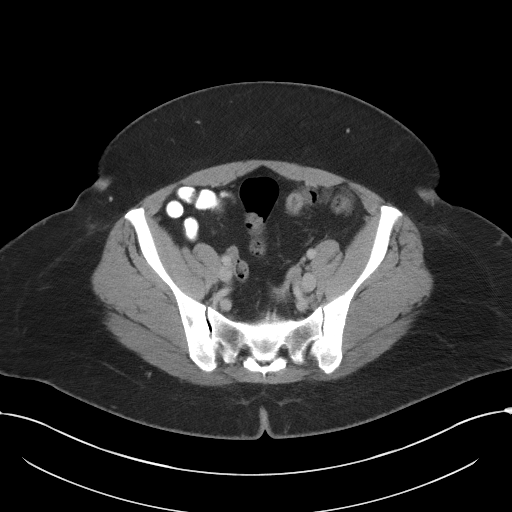
[im 45/100  soft-tissue]
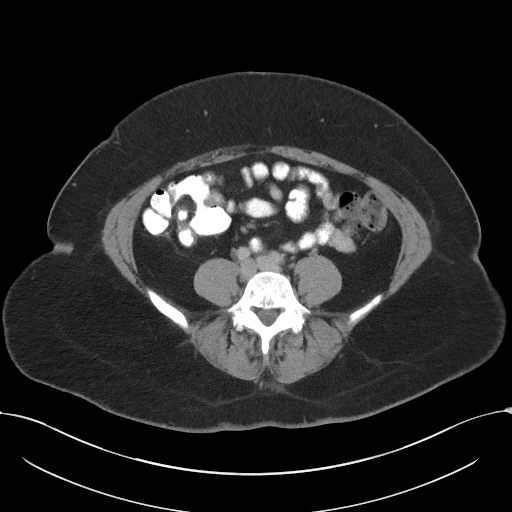
[im 50/100  soft-tissue]
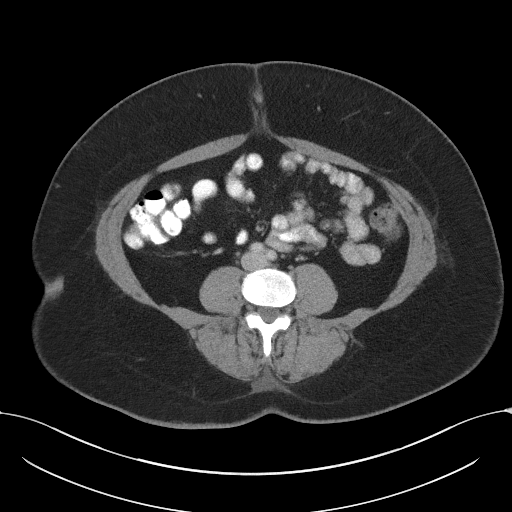
[im 56/100  soft-tissue]
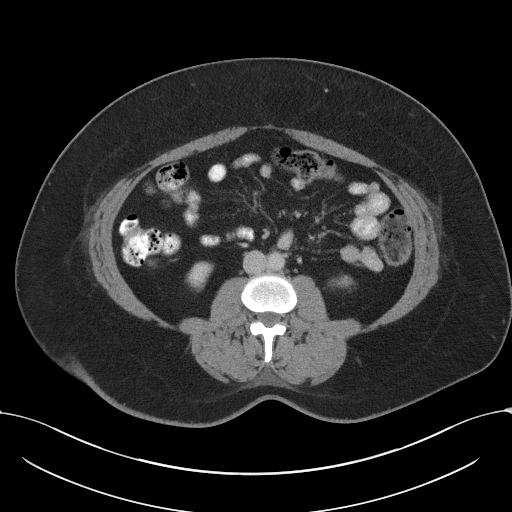
[im 67/100  soft-tissue]
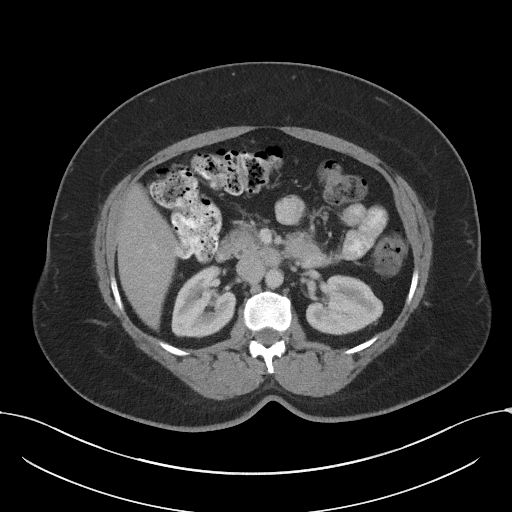
[im 67/100  bone]
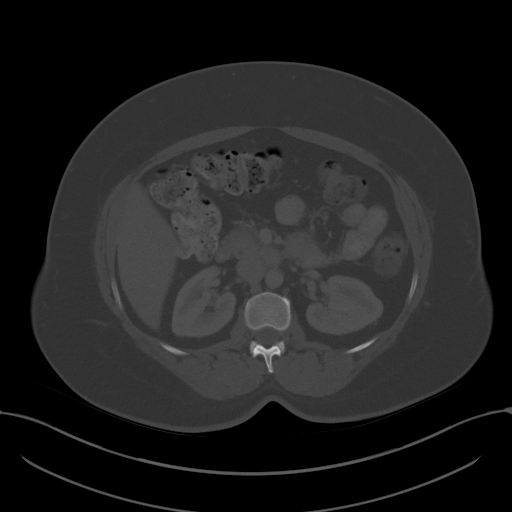
[im 72/100  soft-tissue]
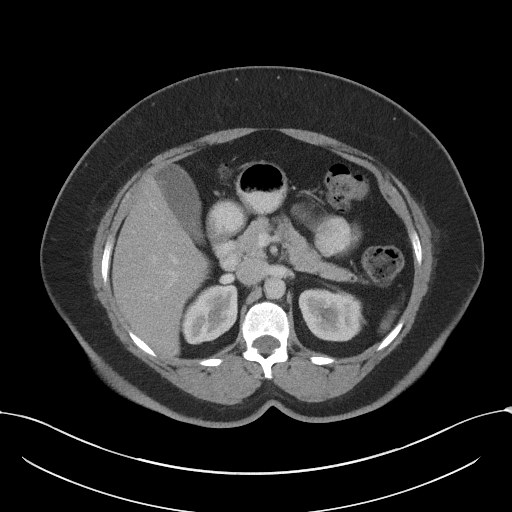
[im 78/100  soft-tissue]
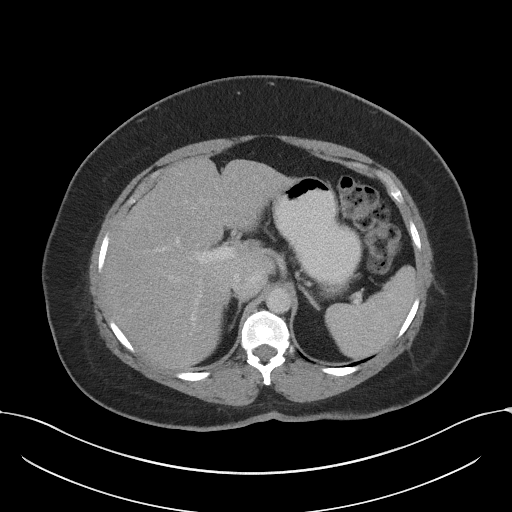
[im 89/100  soft-tissue]
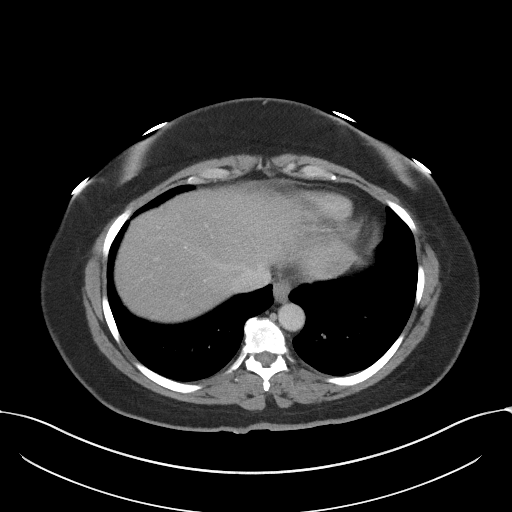
[im 94/100  soft-tissue]
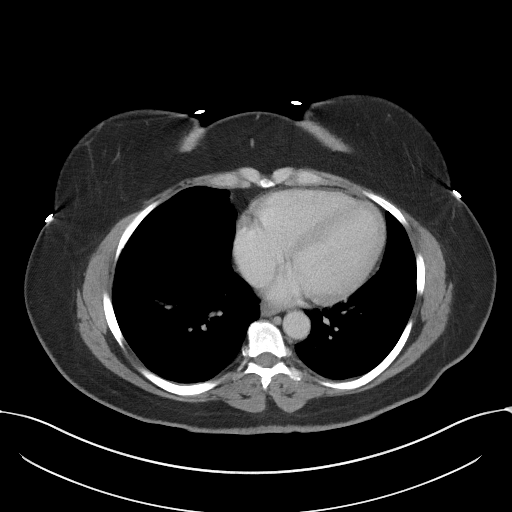

[Series 5: coronal st · coronal · 0.89mm/px · 3 of 105 slices shown]
[im 35/105  soft-tissue]
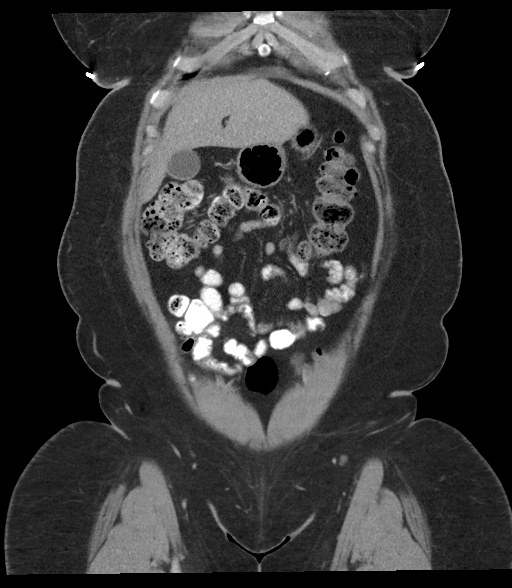
[im 47/105  soft-tissue]
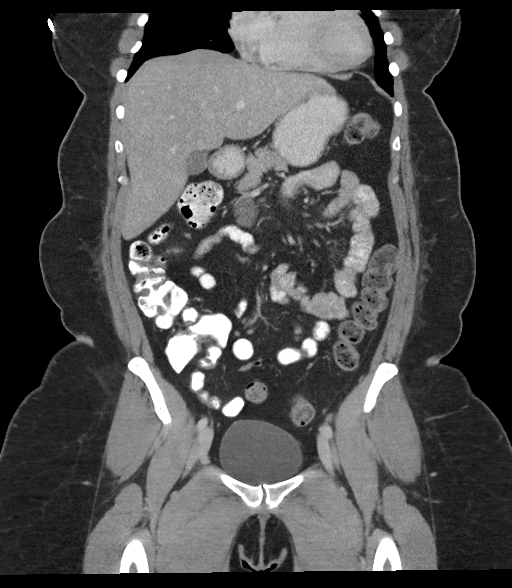
[im 58/105  soft-tissue]
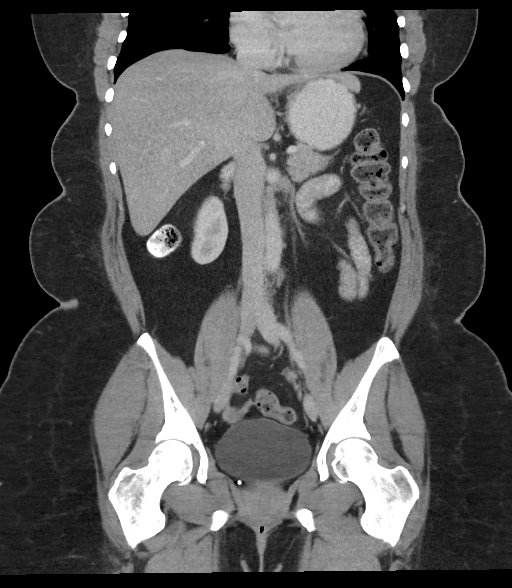

[16 of 46 positions shown; findings below may reference images not displayed]

FINDINGS: Lower chest: No acute abnormality.

Hepatobiliary: No suspicious hepatic lesion. Gallbladder is
unremarkable. No biliary ductal dilation.

Pancreas: No pancreatic ductal dilation or evidence of acute
inflammation.

Spleen: Within normal limits.

Adrenals/Urinary Tract: Bilateral adrenal glands are unremarkable.
No hydronephrosis. No solid enhancing renal mass. Tiny hypodense
right lower pole renal lesion is technically too small to accurately
characterize but statistically likely to reflect a cyst. Urinary
bladder is unremarkable for degree of distension.

Stomach/Bowel: Radiopaque enteric contrast material traverses the
hepatic flexure. Stomach is mildly distended without wall
thickening. No pathologic dilation of large or small bowel. Terminal
ileum appears normal. The appendix is surgically absent. Moderate
volume of formed stool in the colon may reflect constipation. No
evidence of acute bowel inflammation.

Vascular/Lymphatic: No significant vascular findings are present. No
pathologically enlarged abdominal or pelvic lymph nodes.

Reproductive: Enhancing mass in the left myometrium measuring 2.4 cm
on image 75/2 is unchanged dating back to 1280 and most consistent
with a leiomyoma. No suspicious adnexal mass.

Other: No abdominal wall hernia or abnormality. No significant
abdominopelvic ascites.

Musculoskeletal: No acute or significant osseous findings.
IMPRESSION: 1. No acute abdominopelvic findings.
2. Moderate volume of formed stool in the colon may reflect
constipation.

## 2022-04-09 ENCOUNTER — Other Ambulatory Visit: Payer: Self-pay | Admitting: Physician Assistant

## 2022-04-09 DIAGNOSIS — I10 Essential (primary) hypertension: Secondary | ICD-10-CM

## 2022-04-09 NOTE — Telephone Encounter (Signed)
Medication Refill - Medication: lisinopril-hydrochlorothiazide (ZESTORETIC) 20-12.5 MG tablet [[id number]]   amLODipine (NORVASC) 5 MG tablet [[id number]]   metoprolol succinate (TOPROL-XL) 100 MG 24 hr tablet [[id number]]    metoprolol succinate (TOPROL-XL) 25 MG 24 hr tablet [[id number]]   Has the patient contacted their pharmacy?  (Agent: If no, request that the patient contact the pharmacy for the refill. If patient does not wish to contact the pharmacy document the reason why and proceed with request.) (Agent: If yes, when and what did the pharmacy advise?)  Preferred Pharmacy (with phone number or street name): [place], [place]. [id number]   Has the patient been seen for an appointment in the last year OR does the patient have an upcoming appointment? Yes.    Agent: Please be advised that RX refills may take up to 3 business days. We ask that you follow-up with your pharmacy.

## 2022-04-10 ENCOUNTER — Other Ambulatory Visit: Payer: Self-pay | Admitting: Physician Assistant

## 2022-04-10 DIAGNOSIS — I10 Essential (primary) hypertension: Secondary | ICD-10-CM

## 2022-04-10 MED ORDER — AMLODIPINE BESYLATE 5 MG PO TABS: 5.0000 mg | ORAL_TABLET | Freq: Every day | ORAL | 0 refills | Status: DC

## 2022-04-10 MED ORDER — METOPROLOL SUCCINATE ER 100 MG PO TB24: ORAL_TABLET | ORAL | 0 refills | Status: DC

## 2022-04-10 NOTE — Telephone Encounter (Signed)
Refilled 04/10/2021. Requested Prescriptions  Pending Prescriptions Disp Refills   amLODipine (NORVASC) 5 MG tablet [Pharmacy Med Name: AMLODIPINE BESYLATE 5 MG TAB] 30 tablet 2    Sig: TAKE 1 TABLET (5 MG TOTAL) BY MOUTH DAILY.     Cardiovascular: Calcium Channel Blockers 2 Passed - 04/10/2022  1:12 AM      Passed - Last BP in normal range    BP Readings from Last 1 Encounters:  12/28/21 133/80         Passed - Last Heart Rate in normal range    Pulse Readings from Last 1 Encounters:  12/28/21 72         Passed - Valid encounter within last 6 months    Recent Outpatient Visits           3 months ago Gastroesophageal reflux disease with esophagitis without hemorrhage   [place], [place], PA-C   4 months ago Hypertension, unspecified type   [place] Woodall, Weldon, PA-C   4 months ago Abnormal heart rate   [place], [place] Jane, PA-C   6 months ago Essential hypertension   [place] Alfred, Segundo, PA-C   7 months ago Borderline diabetes   [place], [place], [place]

## 2022-04-10 NOTE — Telephone Encounter (Signed)
Requested medications are due for refill today.  unsure  Requested medications are on the active medications list.  yes  Last refill. 90 0 rf  02/14/2022  Future visit scheduled.   no  Notes to clinic.  Pt is prescribed both 25 and '100mg'$ s of this medication. Please review.    Requested Prescriptions  Pending Prescriptions Disp Refills   metoprolol succinate (TOPROL-XL) 100 MG 24 hr tablet 90 tablet 0    Sig: Take with or immediately following a meal.     Cardiovascular:  Beta Blockers Passed - 04/09/2022  5:19 PM      Passed - Last BP in normal range    BP Readings from Last 1 Encounters:  12/28/21 133/80         Passed - Last Heart Rate in normal range    Pulse Readings from Last 1 Encounters:  12/28/21 72         Passed - Valid encounter within last 6 months    Recent Outpatient Visits           3 months ago Gastroesophageal reflux disease with esophagitis without hemorrhage   [place], [place], PA-C   4 months ago Hypertension, unspecified type   [place] Mansfield, Westphalia, PA-C   4 months ago Abnormal heart rate   [place] Prescott, Ravensdale, PA-C   6 months ago Essential hypertension   [place], [place], PA-C   7 months ago Borderline diabetes   [place] Farm[place], [place], [place]              Signed Prescriptions Disp Refills   amLODipine (NORVASC) 5 MG tablet 90 tablet 0    Sig: Take 1 tablet (5 mg total) by mouth daily.     Cardiovascular: Calcium Channel Blockers 2 Passed - 04/09/2022  5:19 PM      Passed - Last BP in normal range    BP Readings from Last 1 Encounters:  12/28/21 133/80         Passed - Last Heart Rate in normal range    Pulse Readings from Last 1 Encounters:  12/28/21 72         Passed - Valid encounter within last 6 months    Recent Outpatient Visits           3 months ago  Gastroesophageal reflux disease with esophagitis without hemorrhage   [place] Torrey, Cheney, PA-C   4 months ago Hypertension, unspecified type   [place]ailua, Backus, PA-C   4 months ago Abnormal heart rate   [place]els[place], Virgil, PA-C   6 months ago Essential hypertension   [place] Bendena, Loudoun Valley Estates, PA-C   7 months ago Borderline diabetes   [place], Guilford, PA-C              Refused Prescriptions Disp Refills   lisinopril-hydrochlorothiazide (ZESTORETIC) 20-12.5 MG tablet 90 tablet 3    Sig: Take 1 tablet by mouth daily.     Cardiovascular:  ACEI + Diuretic Combos Passed - 04/09/2022  5:19 PM      Passed - Na in normal range and within 180 days    Sodium  Date Value Ref Range Status  11/14/2021 140 135 - 145 mmol/L Final  07/18/2021 140 134 - 144 mmol/L Final  Passed - K in normal range and within 180 days    Potassium  Date Value Ref Range Status  11/14/2021 3.6 3.5 - 5.1 mmol/L Final         Passed - Cr in normal range and within 180 days    Creatinine, Ser  Date Value Ref Range Status  11/14/2021 0.78 0.44 - 1.00 mg/dL Final         Passed - eGFR is 30 or above and within 180 days    GFR calc Af Amer  Date Value Ref Range Status  02/23/2020 93 >59 mL/min/1.73 Final    Comment:    **In accordance with recommendations from the NKF-ASN Task force,**   [facility] is in the process of updating its eGFR calculation to the   2021 CKD-EPI creatinine equation that estimates kidney function   without a race variable.    GFR, Estimated  Date Value Ref Range Status  11/14/2021 >60 >60 mL/min Final    Comment:    (NOTE) Calculated using the CKD-EPI Creatinine Equation (2021)    eGFR  Date Value Ref Range Status  07/18/2021 90 >59 mL/min/1.73 Final         Passed - Patient is not pregnant       Passed - Last BP in normal range    BP Readings from Last 1 Encounters:  12/28/21 133/80         Passed - Valid encounter within last 6 months    Recent Outpatient Visits           3 months ago Gastroesophageal reflux disease with esophagitis without hemorrhage   [place], [place] Mills, PA-C   4 months ago Hypertension, unspecified type   [place], Claycomo, PA-C   4 months ago Abnormal heart rate   [place]compton, Lindale, PA-C   6 months ago Essential hypertension   [place] Botkins, Ranburne, PA-C   7 months ago Borderline diabetes   [place] Medina, Dunlap, [place]

## 2022-04-10 NOTE — Telephone Encounter (Signed)
Requested Prescriptions  Pending Prescriptions Disp Refills   metoprolol succinate (TOPROL-XL) 100 MG 24 hr tablet 90 tablet 0    Sig: Take with or immediately following a meal.     Cardiovascular:  Beta Blockers Passed - 04/09/2022  5:19 PM      Passed - Last BP in normal range    BP Readings from Last 1 Encounters:  12/28/21 133/80         Passed - Last Heart Rate in normal range    Pulse Readings from Last 1 Encounters:  12/28/21 72         Passed - Valid encounter within last 6 months    Recent Outpatient Visits           3 months ago Gastroesophageal reflux disease with esophagitis without hemorrhage   [place], [place] Mills, PA-C   4 months ago Hypertension, unspecified type   [place] Highlands, Walnut Grove, PA-C   4 months ago Abnormal heart rate   [place] City, Anchor, PA-C   6 months ago Essential hypertension   [place] Marvel, Vail, PA-C   7 months ago Borderline diabetes   [place] Springs, West Union, PA-C               amLODipine (NORVASC) 5 MG tablet 30 tablet 2    Sig: Take 1 tablet (5 mg total) by mouth daily.     Cardiovascular: Calcium Channel Blockers 2 Passed - 04/09/2022  5:19 PM      Passed - Last BP in normal range    BP Readings from Last 1 Encounters:  12/28/21 133/80         Passed - Last Heart Rate in normal range    Pulse Readings from Last 1 Encounters:  12/28/21 72         Passed - Valid encounter within last 6 months    Recent Outpatient Visits           3 months ago Gastroesophageal reflux disease with esophagitis without hemorrhage   [place], Mosquero, PA-C   4 months ago Hypertension, unspecified type   [place] Helenwood, Prince's Lakes, PA-C   4 months ago Abnormal heart rate   [place] Whitlash,  Roselawn, PA-C   6 months ago Essential hypertension   C[place] Oglala, Ripley, PA-C   7 months ago Borderline diabetes   [place] Henagar, Lawrenceburg, PA-C              Refused Prescriptions Disp Refills   lisinopril-hydrochlorothiazide (ZESTORETIC) 20-12.5 MG tablet 90 tablet 3    Sig: Take 1 tablet by mouth daily.     Cardiovascular:  ACEI + Diuretic Combos Passed - 04/09/2022  5:19 PM      Passed - Na in normal range and within 180 days    Sodium  Date Value Ref Range Status  11/14/2021 140 135 - 145 mmol/L Final  07/18/2021 140 134 - 144 mmol/L Final         Passed - K in normal range and within 180 days    Potassium  Date Value Ref Range Status  11/14/2021 3.6 3.5 - 5.1 mmol/L Final         Passed - Cr in normal range and within 180 days    Creatinine, Ser  Date Value  Ref Range Status  11/14/2021 0.78 0.44 - 1.00 mg/dL Final         Passed - eGFR is 30 or above and within 180 days    GFR calc Af Amer  Date Value Ref Range Status  02/23/2020 93 >59 mL/min/1.73 Final    Comment:    **In accordance with recommendations from the NKF-ASN Task force,**   [facility] is in the process of updating its eGFR calculation to the   2021 CKD-EPI creatinine equation that estimates kidney function   without a race variable.    GFR, Estimated  Date Value Ref Range Status  11/14/2021 >60 >60 mL/min Final    Comment:    (NOTE) Calculated using the CKD-EPI Creatinine Equation (2021)    eGFR  Date Value Ref Range Status  07/18/2021 90 >59 mL/min/1.73 Final         Passed - Patient is not pregnant      Passed - Last BP in normal range    BP Readings from Last 1 Encounters:  12/28/21 133/80         Passed - Valid encounter within last 6 months    Recent Outpatient Visits           3 months ago Gastroesophageal reflux disease with esophagitis without hemorrhage   [place],  [place], PA-C   4 months ago Hypertension, unspecified type   [place] Hodge, Barker Ten Mile, PA-C   4 months ago Abnormal heart rate   [place] Mount Angel, Sellersville, PA-C   6 months ago Essential hypertension   Con[place] Yorkshire, Maili, PA-C   7 months ago Borderline diabetes   [place] Rockledge, Luquillo, [place]

## 2022-04-18 ENCOUNTER — Other Ambulatory Visit: Payer: Self-pay | Admitting: Physician Assistant

## 2022-04-18 DIAGNOSIS — I10 Essential (primary) hypertension: Secondary | ICD-10-CM

## 2022-04-18 NOTE — Telephone Encounter (Signed)
Requested medication (s) are due for refill today:No  Requested medication (s) are on the active medication list: yes    Last refill: 11/14/21  #90  3 refills  Future visit scheduled no  Notes to clinic:Historical provider, please review. Thank you  Requested Prescriptions  Pending Prescriptions Disp Refills   lisinopril-hydrochlorothiazide (ZESTORETIC) 20-12.5 MG tablet 90 tablet 3    Sig: Take 1 tablet by mouth daily.     Cardiovascular:  ACEI + Diuretic Combos Passed - 04/18/2022  3:39 PM      Passed - Na in normal range and within 180 days    Sodium  Date Value Ref Range Status  11/14/2021 140 135 - 145 mmol/L Final  07/18/2021 140 134 - 144 mmol/L Final         Passed - K in normal range and within 180 days    Potassium  Date Value Ref Range Status  11/14/2021 3.6 3.5 - 5.1 mmol/L Final         Passed - Cr in normal range and within 180 days    Creatinine, Ser  Date Value Ref Range Status  11/14/2021 0.78 0.44 - 1.00 mg/dL Final         Passed - eGFR is 30 or above and within 180 days    GFR calc Af Amer  Date Value Ref Range Status  02/23/2020 93 >59 mL/min/1.73 Final    Comment:    **In accordance with recommendations from the NKF-ASN Task force,**   [facility] is in the process of updating its eGFR calculation to the   2021 CKD-EPI creatinine equation that estimates kidney function   without a race variable.    GFR, Estimated  Date Value Ref Range Status  11/14/2021 >60 >60 mL/min Final    Comment:    (NOTE) Calculated using the CKD-EPI Creatinine Equation (2021)    eGFR  Date Value Ref Range Status  07/18/2021 90 >59 mL/min/1.73 Final         Passed - Patient is not pregnant      Passed - Last BP in normal range    BP Readings from Last 1 Encounters:  12/28/21 133/80         Passed - Valid encounter within last 6 months    Recent Outpatient Visits           3 months ago Gastroesophageal reflux disease with esophagitis without hemorrhage    [place] Center, Brandt, PA-C   4 months ago Hypertension, unspecified type   [place] Eugene, Edisto Beach, PA-C   5 months ago Abnormal heart rate   [place] Cliff Village, The Crossings, PA-C   6 months ago Essential hypertension   [place] Hopatcong, Ocean Pines, PA-C   7 months ago Borderline diabetes   [place] Blockton, Alta, PA-C              Refused Prescriptions Disp Refills   amLODipine (NORVASC) 5 MG tablet 90 tablet 0    Sig: Take 1 tablet (5 mg total) by mouth daily.     Cardiovascular: Calcium Channel Blockers 2 Passed - 04/18/2022  3:39 PM      Passed - Last BP in normal range    BP Readings from Last 1 Encounters:  12/28/21 133/80         Passed - Last Heart Rate in normal range    Pulse Readings from Last 1 Encounters:  12/28/21 72         Passed - Valid encounter within last 6 months    Recent Outpatient Visits           3 months ago Gastroesophageal reflux disease with esophagitis without hemorrhage   [place], Rollinsville, PA-C   4 months ago Hypertension, unspecified type   Dog[place] Spring Garden, Blackey, PA-C   5 months ago Abnormal heart rate   S[place] Burdette, Mount Hope, PA-C   6 months ago Essential hypertension   [place]end[place] Renton, Lincoln, PA-C   7 months ago Borderline diabetes   [place] March ARB, Hazelton, PA-C               metoprolol succinate (TOPROL-XL) 100 MG 24 hr tablet 90 tablet 0    Sig: TAKE 1 TABLET BY MOUTH EVERY DAY WITH OR IMMEDIATELY FOLLOWING A MEAL     Cardiovascular:  Beta Blockers Passed - 04/18/2022  3:39 PM      Passed - Last BP in normal range    BP Readings from Last 1 Encounters:  12/28/21 133/80         Passed - Last Heart Rate in normal range     Pulse Readings from Last 1 Encounters:  12/28/21 72         Passed - Valid encounter within last 6 months    Recent Outpatient Visits           3 months ago Gastroesophageal reflux disease with esophagitis without hemorrhage   [place] Kenyon, Castle Dale, PA-C   4 months ago Hypertension, unspecified type   [place]roville, Wiggins, PA-C   5 months ago Abnormal heart rate   [place]aven[place] Tolchester, Aberdeen, PA-C   6 months ago Essential hypertension   Alic[place] Alto, Pataha, PA-C   7 months ago Borderline diabetes   [place] Ward, Churchville, PA-C               metoprolol succinate (TOPROL-XL) 25 MG 24 hr tablet 90 tablet 3    Sig: Take 1 tablet (25 mg total) by mouth daily.     Cardiovascular:  Beta Blockers Passed - 04/18/2022  3:39 PM      Passed - Last BP in normal range    BP Readings from Last 1 Encounters:  12/28/21 133/80         Passed - Last Heart Rate in normal range    Pulse Readings from Last 1 Encounters:  12/28/21 72         Passed - Valid encounter within last 6 months    Recent Outpatient Visits           3 months ago Gastroesophageal reflux disease with esophagitis without hemorrhage   [place] Quasqueton, Bay Hill, PA-C   4 months ago Hypertension, unspecified type   [place] Fairhaven, Savanna, PA-C   5 months ago Abnormal heart rate   [place] Alicia, East Springfield, PA-C   6 months ago Essential hypertension   [place] Franklin Springs, Penndel, PA-C   7 months ago Borderline diabetes   [place] Between, Dresden, [place]

## 2022-04-18 NOTE — Telephone Encounter (Unsigned)
Copied from [place] [phone]. Topic: General - Other >> Apr 18, 2022  2:28 PM Everette C wrote: Reason for CRM: Medication Refill - Medication: amLODipine (NORVASC) 5 MG tablet [[id number]]  lisinopril-hydrochlorothiazide (ZESTORETIC) 20-12.5 MG tablet [[id number]]  metoprolol succinate (TOPROL-XL) 100 MG 24 hr tablet [[id number]]  metoprolol succinate (TOPROL-XL) 25 MG 24 hr tablet [[id number]]  Has the patient contacted their pharmacy? Yes.   (Agent: If no, request that the patient contact the pharmacy for the refill. If patient does not wish to contact the pharmacy document the reason why and proceed with request.) (Agent: If yes, when and what did the pharmacy advise?)  Preferred Pharmacy (with phone number or street name): [place], [place] [address] [facility] [place] [address] Phone: [phone] Fax: [phone] Hours: Not open 24 hours   Has the patient been seen for an appointment in the last year OR does the patient have an upcoming appointment? Yes.    Agent: Please be advised that RX refills may take up to 3 business days. We ask that you follow-up with your pharmacy.

## 2022-04-18 NOTE — Telephone Encounter (Signed)
Requested Prescriptions  Pending Prescriptions Disp Refills   amLODipine (NORVASC) 5 MG tablet 90 tablet 0    Sig: Take 1 tablet (5 mg total) by mouth daily.     Cardiovascular: Calcium Channel Blockers 2 Passed - 04/18/2022  3:39 PM      Passed - Last BP in normal range    BP Readings from Last 1 Encounters:  12/28/21 133/80         Passed - Last Heart Rate in normal range    Pulse Readings from Last 1 Encounters:  12/28/21 72         Passed - Valid encounter within last 6 months    Recent Outpatient Visits           3 months ago Gastroesophageal reflux disease with esophagitis without hemorrhage   [place] Paukaa, New Rockport Colony, PA-C   4 months ago Hypertension, unspecified type   G[place] Pinole, Fredonia, PA-C   5 months ago Abnormal heart rate   [place] Deerwood, Wildwood Crest, PA-C   6 months ago Essential hypertension   [place] Lomas Verdes Comunidad, Niagara, PA-C   7 months ago Borderline diabetes   [place] Valley, Winfield, PA-C               lisinopril-hydrochlorothiazide (ZESTORETIC) 20-12.5 MG tablet 90 tablet 3    Sig: Take 1 tablet by mouth daily.     Cardiovascular:  ACEI + Diuretic Combos Passed - 04/18/2022  3:39 PM      Passed - Na in normal range and within 180 days    Sodium  Date Value Ref Range Status  11/14/2021 140 135 - 145 mmol/L Final  07/18/2021 140 134 - 144 mmol/L Final         Passed - K in normal range and within 180 days    Potassium  Date Value Ref Range Status  11/14/2021 3.6 3.5 - 5.1 mmol/L Final         Passed - Cr in normal range and within 180 days    Creatinine, Ser  Date Value Ref Range Status  11/14/2021 0.78 0.44 - 1.00 mg/dL Final         Passed - eGFR is 30 or above and within 180 days    GFR calc Af Amer  Date Value Ref Range Status  02/23/2020 93 >59 mL/min/1.73 Final    Comment:    **In  accordance with recommendations from the NKF-ASN Task force,**   [facility] is in the process of updating its eGFR calculation to the   2021 CKD-EPI creatinine equation that estimates kidney function   without a race variable.    GFR, Estimated  Date Value Ref Range Status  11/14/2021 >60 >60 mL/min Final    Comment:    (NOTE) Calculated using the CKD-EPI Creatinine Equation (2021)    eGFR  Date Value Ref Range Status  07/18/2021 90 >59 mL/min/1.73 Final         Passed - Patient is not pregnant      Passed - Last BP in normal range    BP Readings from Last 1 Encounters:  12/28/21 133/80         Passed - Valid encounter within last 6 months    Recent Outpatient Visits           3 months ago Gastroesophageal reflux disease with esophagitis without hemorrhage   [place], Hickory Hills, PA-C   4 months ago Hypertension, unspecified type   [place] Car[place] Clarkson Valley, Meadowlands, PA-C   5 months ago Abnormal heart rate   [place] San Saba, Newell, PA-C   6 months ago Essential hypertension   [place] Eastshore, Fairborn, PA-C   7 months ago Borderline diabetes   [place] Highland Park, Ottawa, PA-C               metoprolol succinate (TOPROL-XL) 100 MG 24 hr tablet 90 tablet 0    Sig: TAKE 1 TABLET BY MOUTH EVERY DAY WITH OR IMMEDIATELY FOLLOWING A MEAL     Cardiovascular:  Beta Blockers Passed - 04/18/2022  3:39 PM      Passed - Last BP in normal range    BP Readings from Last 1 Encounters:  12/28/21 133/80         Passed - Last Heart Rate in normal range    Pulse Readings from Last 1 Encounters:  12/28/21 72         Passed - Valid encounter within last 6 months    Recent Outpatient Visits           3 months ago Gastroesophageal reflux disease with esophagitis without hemorrhage   [place] Sandy Oaks, Dormont, PA-C    4 months ago Hypertension, unspecified type   [place] Prairie, Swepsonville, PA-C   5 months ago Abnormal heart rate   [place] Badger, Defiance, PA-C   6 months ago Essential hypertension   [place] Bay Pines, Leisure Village West, PA-C   7 months ago Borderline diabetes   [place] Melvin, Oatfield, PA-C               metoprolol succinate (TOPROL-XL) 25 MG 24 hr tablet 90 tablet 3    Sig: Take 1 tablet (25 mg total) by mouth daily.     Cardiovascular:  Beta Blockers Passed - 04/18/2022  3:39 PM      Passed - Last BP in normal range    BP Readings from Last 1 Encounters:  12/28/21 133/80         Passed - Last Heart Rate in normal range    Pulse Readings from Last 1 Encounters:  12/28/21 72         Passed - Valid encounter within last 6 months    Recent Outpatient Visits           3 months ago Gastroesophageal reflux disease with esophagitis without hemorrhage   [place] Chilili, Morgantown, PA-C   4 months ago Hypertension, unspecified type   [place] Airway Heights, Casselman, PA-C   5 months ago Abnormal heart rate   [place] Ramona, Alcalde, PA-C   6 months ago Essential hypertension   [place] Succasunna, Barstow, PA-C   7 months ago Borderline diabetes   [place], [place], [place]

## 2022-05-04 ENCOUNTER — Other Ambulatory Visit: Payer: Self-pay | Admitting: Physician Assistant

## 2022-05-04 DIAGNOSIS — I10 Essential (primary) hypertension: Secondary | ICD-10-CM

## 2022-05-04 NOTE — Telephone Encounter (Signed)
Medication Refill - Medication: amLODipine (NORVASC) 5 MG tablet lisinopril-hydrochlorothiazide (ZESTORETIC) 20-12.5 MG tablet metoprolol succinate (TOPROL-XL) 100 MG 24 hr tablet metoprolol succinate (TOPROL-XL) 25 MG 24 hr tablet   Has the patient contacted their pharmacy? Yes.    (Agent: If yes, when and what did the pharmacy advise?) Contact Provider   Preferred Pharmacy (with phone number or street name): [facility] [id number], [place]   Has the patient been seen for an appointment in the last year OR does the patient have an upcoming appointment? Yes.    Agent: Please be advised that RX refills may take up to 3 business days. We ask that you follow-up with your pharmacy.

## 2022-05-07 ENCOUNTER — Other Ambulatory Visit: Payer: Self-pay | Admitting: Physician Assistant

## 2022-05-07 DIAGNOSIS — I10 Essential (primary) hypertension: Secondary | ICD-10-CM

## 2022-05-07 NOTE — Telephone Encounter (Signed)
Requested Prescriptions  Pending Prescriptions Disp Refills   metoprolol succinate (TOPROL-XL) 100 MG 24 hr tablet 90 tablet 0    Sig: TAKE 1 TABLET BY MOUTH EVERY DAY WITH OR IMMEDIATELY FOLLOWING A MEAL     Cardiovascular:  Beta Blockers Passed - 05/04/2022  3:51 PM      Passed - Last BP in normal range    BP Readings from Last 1 Encounters:  12/28/21 133/80         Passed - Last Heart Rate in normal range    Pulse Readings from Last 1 Encounters:  12/28/21 72         Passed - Valid encounter within last 6 months    Recent Outpatient Visits           4 months ago Gastroesophageal reflux disease with esophagitis without hemorrhage   [place], [place], PA-C   5 months ago Hypertension, unspecified type   [place], Ches[place], PA-C   5 months ago Abnormal heart rate   [place], [place], PA-C   7 months ago Essential hypertension   [place], Frost, PA-C   8 months ago Borderline diabetes   [place], [place], PA-C               metoprolol succinate (TOPROL-XL) 25 MG 24 hr tablet 90 tablet 3    Sig: Take 1 tablet (25 mg total) by mouth daily.     Cardiovascular:  Beta Blockers Passed - 05/04/2022  3:51 PM      Passed - Last BP in normal range    BP Readings from Last 1 Encounters:  12/28/21 133/80         Passed - Last Heart Rate in normal range    Pulse Readings from Last 1 Encounters:  12/28/21 72         Passed - Valid encounter within last 6 months    Recent Outpatient Visits           4 months ago Gastroesophageal reflux disease with esophagitis without hemorrhage   [place], [place], PA-C   5 months ago Hypertension, unspecified type   [place], [place], PA-C   5 months ago Abnormal heart rate   [place] Donaldson, Gem, PA-C   7 months ago Essential hypertension   [place], Edgewood, PA-C   8 months ago Borderline diabetes   [place], [place], PA-C              Refused Prescriptions Disp Refills   amLODipine (NORVASC) 5 MG tablet 90 tablet 0    Sig: Take 1 tablet (5 mg total) by mouth daily.     Cardiovascular: Calcium Channel Blockers 2 Passed - 05/04/2022  3:51 PM      Passed - Last BP in normal range    BP Readings from Last 1 Encounters:  12/28/21 133/80         Passed - Last Heart Rate in normal range    Pulse Readings from Last 1 Encounters:  12/28/21 72         Passed - Valid encounter within last 6 months    Recent Outpatient Visits           4 months ago Gastroesophageal reflux disease with esophagitis without  hemorrhage   [place], [place], [place]   5 months ago Hypertension, unspecified type   [place], [place], [place]   5 months ago Abnormal heart rate   [place], [place], [place]   7 months ago Essential hypertension   [place], [place], [place]   8 months ago Borderline diabetes   [place], Munford, PA-C               lisinopril-hydrochlorothiazide (ZESTORETIC) 20-12.5 MG tablet 90 tablet 3    Sig: Take 1 tablet by mouth daily.     Cardiovascular:  ACEI + Diuretic Combos Passed - 05/04/2022  3:51 PM      Passed - Na in normal range and within 180 days    Sodium  Date Value Ref Range Status  11/14/2021 140 135 - 145 mmol/L Final  07/18/2021 140 134 - 144 mmol/L Final         Passed - K in normal range and within 180 days    Potassium  Date Value Ref Range Status  11/14/2021 3.6 3.5 - 5.1 mmol/L Final         Passed - Cr in normal range and within 180 days    Creatinine, Ser   Date Value Ref Range Status  11/14/2021 0.78 0.44 - 1.00 mg/dL Final         Passed - eGFR is 30 or above and within 180 days    GFR calc Af Amer  Date Value Ref Range Status  02/23/2020 93 >59 mL/min/1.73 Final    Comment:    **In accordance with recommendations from the NKF-ASN Task force,**   [facility] is in the process of updating its eGFR calculation to the   2021 CKD-EPI creatinine equation that estimates kidney function   without a race variable.    GFR, Estimated  Date Value Ref Range Status  11/14/2021 >60 >60 mL/min Final    Comment:    (NOTE) Calculated using the CKD-EPI Creatinine Equation (2021)    eGFR  Date Value Ref Range Status  07/18/2021 90 >59 mL/min/1.73 Final         Passed - Patient is not pregnant      Passed - Last BP in normal range    BP Readings from Last 1 Encounters:  12/28/21 133/80         Passed - Valid encounter within last 6 months    Recent Outpatient Visits           4 months ago Gastroesophageal reflux disease with esophagitis without hemorrhage   [place], Rye, PA-C   5 months ago Hypertension, unspecified type   [place]lake, [place] Mill Creek, PA-C   5 months ago Abnormal heart rate   [place], Yakima, PA-C   7 months ago Essential hypertension   [place]port, Jasper, PA-C   8 months ago Borderline diabetes   [place], [place]ort[place], [place]

## 2022-05-07 NOTE — Telephone Encounter (Signed)
Requested medications are due for refill today.  Unsure  Requested medications are on the active medications list.  yes  Last refill.   Future visit scheduled.   no  Notes to clinic.  Pt has 2 rx's for the same medication with different doses. Please review.    Requested Prescriptions  Pending Prescriptions Disp Refills   metoprolol succinate (TOPROL-XL) 100 MG 24 hr tablet 90 tablet 0    Sig: TAKE 1 TABLET BY MOUTH EVERY DAY WITH OR IMMEDIATELY FOLLOWING A MEAL     Cardiovascular:  Beta Blockers Passed - 05/04/2022  3:51 PM      Passed - Last BP in normal range    BP Readings from Last 1 Encounters:  12/28/21 133/80         Passed - Last Heart Rate in normal range    Pulse Readings from Last 1 Encounters:  12/28/21 72         Passed - Valid encounter within last 6 months    Recent Outpatient Visits           4 months ago Gastroesophageal reflux disease with esophagitis without hemorrhage   [place], Yznaga, PA-C   5 months ago Hypertension, unspecified type   [place], Mountain Ranch, PA-C   5 months ago Abnormal heart rate   [place], Lakewood, PA-C   7 months ago Essential hypertension   [place], Kennesaw, PA-C   8 months ago Borderline diabetes   [place], Patmos, PA-C               metoprolol succinate (TOPROL-XL) 25 MG 24 hr tablet 90 tablet 3    Sig: Take 1 tablet (25 mg total) by mouth daily.     Cardiovascular:  Beta Blockers Passed - 05/04/2022  3:51 PM      Passed - Last BP in normal range    BP Readings from Last 1 Encounters:  12/28/21 133/80         Passed - Last Heart Rate in normal range    Pulse Readings from Last 1 Encounters:  12/28/21 72         Passed - Valid encounter within last 6 months    Recent Outpatient Visits           4 months ago Gastroesophageal  reflux disease with esophagitis without hemorrhage   [place], Boyd, PA-C   5 months ago Hypertension, unspecified type   [place], Wayton, PA-C   5 months ago Abnormal heart rate   [place], Decatur, PA-C   7 months ago Essential hypertension   [place]ville, Hayti, PA-C   8 months ago Borderline diabetes   [place] Point, Hassell, PA-C              Refused Prescriptions Disp Refills   amLODipine (NORVASC) 5 MG tablet 90 tablet 0    Sig: Take 1 tablet (5 mg total) by mouth daily.     Cardiovascular: Calcium Channel Blockers 2 Passed - 05/04/2022  3:51 PM      Passed - Last BP in normal range    BP Readings from Last 1 Encounters:  12/28/21 133/80         Passed - Last Heart Rate in normal range  Pulse Readings from Last 1 Encounters:  12/28/21 72         Passed - Valid encounter within last 6 months    Recent Outpatient Visits           4 months ago Gastroesophageal reflux disease with esophagitis without hemorrhage   [place], Brinsmade, PA-C   5 months ago Hypertension, unspecified type   Julian [place]elaw, McAllister, PA-C   5 months ago Abnormal heart rate   [place] Mountain Center, Midwest City, PA-C   7 months ago Essential hypertension   [place]om, Sturtevant, PA-C   8 months ago Borderline diabetes   [place], Yettem, PA-C               lisinopril-hydrochlorothiazide (ZESTORETIC) 20-12.5 MG tablet 90 tablet 3    Sig: Take 1 tablet by mouth daily.     Cardiovascular:  ACEI + Diuretic Combos Passed - 05/04/2022  3:51 PM      Passed - Na in normal range and within 180 days    Sodium  Date Value Ref Range Status  11/14/2021 140 135 - 145  mmol/L Final  07/18/2021 140 134 - 144 mmol/L Final         Passed - K in normal range and within 180 days    Potassium  Date Value Ref Range Status  11/14/2021 3.6 3.5 - 5.1 mmol/L Final         Passed - Cr in normal range and within 180 days    Creatinine, Ser  Date Value Ref Range Status  11/14/2021 0.78 0.44 - 1.00 mg/dL Final         Passed - eGFR is 30 or above and within 180 days    GFR calc Af Amer  Date Value Ref Range Status  02/23/2020 93 >59 mL/min/1.73 Final    Comment:    **In accordance with recommendations from the NKF-ASN Task force,**   [facility] is in the process of updating its eGFR calculation to the   2021 CKD-EPI creatinine equation that estimates kidney function   without a race variable.    GFR, Estimated  Date Value Ref Range Status  11/14/2021 >60 >60 mL/min Final    Comment:    (NOTE) Calculated using the CKD-EPI Creatinine Equation (2021)    eGFR  Date Value Ref Range Status  07/18/2021 90 >59 mL/min/1.73 Final         Passed - Patient is not pregnant      Passed - Last BP in normal range    BP Readings from Last 1 Encounters:  12/28/21 133/80         Passed - Valid encounter within last 6 months    Recent Outpatient Visits           4 months ago Gastroesophageal reflux disease with esophagitis without hemorrhage   [place], Melia, PA-C   5 months ago Hypertension, unspecified type   [place]ates, Elizabethville, PA-C   5 months ago Abnormal heart rate   [place] Lengby, Goshen, PA-C   7 months ago Essential hypertension   [place], Raytown, PA-C   8 months ago Borderline diabetes   [place] Wyndham, [place], [place]

## 2022-05-08 NOTE — Telephone Encounter (Signed)
duplicate

## 2022-05-09 NOTE — Telephone Encounter (Signed)
Unable to refill per protocol, Rx request for lisinopril was discontinued 11/23/21. Request for metoprolol is too soon, last refill 04/10/22 for 90 days.  Requested Prescriptions  Pending Prescriptions Disp Refills   metoprolol succinate (TOPROL-XL) 100 MG 24 hr tablet [Pharmacy Med Name: METOPROLOL SUCC ER 100 MG TAB] 30 tablet 2    Sig: TAKE 1 TABLET BY MOUTH EVERY DAY WITH OR IMMEDIATELY FOLLOWING A MEAL *[facility] OR MAIL-IN PREFER*     Cardiovascular:  Beta Blockers Passed - 05/07/2022  3:42 PM      Passed - Last BP in normal range    BP Readings from Last 1 Encounters:  12/28/21 133/80         Passed - Last Heart Rate in normal range    Pulse Readings from Last 1 Encounters:  12/28/21 72         Passed - Valid encounter within last 6 months    Recent Outpatient Visits           4 months ago Gastroesophageal reflux disease with esophagitis without hemorrhage   [place]msworth, North Port, PA-C   5 months ago Hypertension, unspecified type   [place] Grafton, Waumandee, PA-C   5 months ago Abnormal heart rate   [place], Misenheimer, PA-C   7 months ago Essential hypertension   [place] Woodruff, Yorktown, PA-C   8 months ago Borderline diabetes   [place]ch, Cocoa Beach, PA-C               lisinopril (ZESTRIL) 10 MG tablet [Pharmacy Med Name: LISINOPRIL 10 MG TABLET] 30 tablet 2    Sig: TAKE 1 TABLET BY MOUTH EVERY DAY     Cardiovascular:  ACE Inhibitors Passed - 05/07/2022  3:42 PM      Passed - Cr in normal range and within 180 days    Creatinine, Ser  Date Value Ref Range Status  11/14/2021 0.78 0.44 - 1.00 mg/dL Final         Passed - K in normal range and within 180 days    Potassium  Date Value Ref Range Status  11/14/2021 3.6 3.5 - 5.1 mmol/L Final         Passed - Patient is not pregnant      Passed - Last BP in  normal range    BP Readings from Last 1 Encounters:  12/28/21 133/80         Passed - Valid encounter within last 6 months    Recent Outpatient Visits           4 months ago Gastroesophageal reflux disease with esophagitis without hemorrhage   [place] Crocker, [place]in[place], PA-C   5 months ago Hypertension, unspecified type   [place], Central, PA-C   5 months ago Abnormal heart rate   [place], Sabillasville, PA-C   7 months ago Essential hypertension   [place], Parkdale, PA-C   8 months ago Borderline diabetes   [place], [place], [place]

## 2022-05-23 ENCOUNTER — Other Ambulatory Visit: Payer: Self-pay | Admitting: Physician Assistant

## 2022-05-23 DIAGNOSIS — Z1231 Encounter for screening mammogram for malignant neoplasm of breast: Secondary | ICD-10-CM

## 2022-06-13 ENCOUNTER — Ambulatory Visit: Payer: Managed Care, Other (non HMO) | Admitting: Physician Assistant

## 2022-06-13 ENCOUNTER — Ambulatory Visit
Admission: RE | Admit: 2022-06-13 | Discharge: 2022-06-13 | Disposition: A | Discharge status: Home / Self Care | Payer: Managed Care, Other (non HMO) | Source: Ambulatory Visit | Attending: Physician Assistant | Admitting: Physician Assistant

## 2022-06-13 VITALS — BP 130/79 | HR 66 | Temp 98.0°F | Wt 210.0 lb

## 2022-06-13 DIAGNOSIS — R197 Diarrhea, unspecified: Secondary | ICD-10-CM | POA: Diagnosis not present

## 2022-06-13 DIAGNOSIS — K2100 Gastro-esophageal reflux disease with esophagitis, without bleeding: Secondary | ICD-10-CM

## 2022-06-13 DIAGNOSIS — K589 Irritable bowel syndrome without diarrhea: Secondary | ICD-10-CM

## 2022-06-13 DIAGNOSIS — Z6838 Body mass index (BMI) 38.0-38.9, adult: Secondary | ICD-10-CM

## 2022-06-13 DIAGNOSIS — Z1231 Encounter for screening mammogram for malignant neoplasm of breast: Secondary | ICD-10-CM

## 2022-06-13 NOTE — Progress Notes (Signed)
Established patient visit   Patient: Anna [name]   DOB: 27-Aug-1970   52 y.o. Female  MRN: [id number] Visit Date: 06/13/2022  Today's healthcare provider: Mardene Speak, PA-C   CC: ED discharge FU for bloody diarrhea  Subjective    HPI  Patient is a 52 year old female who presents for follow up after emergency room visit  on 06/04/22.  Patient was seen at [facility] ER for diarrhea with blood.  It was noted she has history of IBS, GERD and H. Pylori.   Patient reports she is doing better and has not seen since 06/05/22 and she reports her bowel movements have been normal in form and color since around 06/08/22.  Her IBS and GERD symptoms are controlled with diet, avoidance of trigger food  Medications: Outpatient Medications Prior to Visit  Medication Sig   amLODipine (NORVASC) 5 MG tablet Take 1 tablet (5 mg total) by mouth daily.   citalopram (CELEXA) 10 MG tablet TAKE 1 TABLET BY MOUTH EVERY DAY   lisinopril-hydrochlorothiazide (ZESTORETIC) 20-12.5 MG tablet Take 1 tablet by mouth daily.   metoprolol succinate (TOPROL-XL) 100 MG 24 hr tablet TAKE 1 TABLET BY MOUTH EVERY DAY WITH OR IMMEDIATELY FOLLOWING A MEAL   metoprolol succinate (TOPROL-XL) 25 MG 24 hr tablet Take 1 tablet (25 mg total) by mouth daily.   Vitamin D, Ergocalciferol, (DRISDOL) 1.25 MG (50000 UNIT) CAPS capsule Take 1 capsule (50,000 Units total) by mouth every 7 (seven) days.   No facility-administered medications prior to visit.    Review of Systems  All other systems reviewed and are negative. Except see HPI      Objective    BP 130/79   Pulse 66   Temp 98 F (36.7 C) (Oral)   Wt 210 lb (95.3 kg)   LMP 03/19/2017 (Approximate) Comment: Preg Test Negative  SpO2 100%   BMI 38.41 kg/m  Vitals:   06/13/22 1545 06/13/22 1554  BP: (!) 133/93 130/79  Pulse: 66   Temp: 98 F (36.7 C)   TempSrc: Oral   SpO2: 100%   Weight: 210 lb (95.3 kg)      Physical Exam Vitals reviewed.   Constitutional:      General: She is not in acute distress.    Appearance: Normal appearance. She is well-developed. She is not diaphoretic.  HENT:     Head: Normocephalic and atraumatic.  Eyes:     General: No scleral icterus.    Conjunctiva/sclera: Conjunctivae normal.  Neck:     Thyroid: No thyromegaly.  Cardiovascular:     Rate and Rhythm: Normal rate and regular rhythm.     Pulses: Normal pulses.     Heart sounds: Normal heart sounds. No murmur heard. Pulmonary:     Effort: Pulmonary effort is normal. No respiratory distress.     Breath sounds: Normal breath sounds. No wheezing, rhonchi or rales.  Musculoskeletal:     Cervical back: Neck supple.     Right lower leg: No edema.     Left lower leg: No edema.  Lymphadenopathy:     Cervical: No cervical adenopathy.  Skin:    General: Skin is warm and dry.     Findings: No rash.  Neurological:     Mental Status: She is alert and oriented to person, place, and time. Mental status is at baseline.  Psychiatric:        Mood and Affect: Mood normal.  Behavior: Behavior normal.      No results found for any visits on 06/13/22.  Assessment & Plan     1. Diarrhea, unspecified type Post Ed visit on 06/04/22 Resolved Improved on bland diet and proper hydration with electrolytes Will monitor  2. Gastroesophageal reflux disease with esophagitis without hemorrhage Continue PPI OTC PRN Elevate the head of the bed 6-8 inches, avoid recumbency for 3 hours after eating, avoid trigger food,  weight loss   Will monitor  3. Irritable bowel syndrome, unspecified type Chronic and stable Low fat, high fiber and unprocessed food diet continue. Continue to avoid beverages containing sorbitol or fructose, avoid gas-producing foods. Recommended a good-quality Sleep and daily exercise.  Continue citalopram 10mg  daily for control of brain component of the brain-gut interaction  Of the IBS. Will monitor  4. Obesity Chronic and  improving Congratulated pt with weight loss of 10 pounds/5.2.23 Advised to continue healthy diet and regular exercise Continue weight loss via lifestyle modifications Will FU  No follow-ups on file.     The patient was advised to call back or seek an in-person evaluation if the symptoms worsen or if the condition fails to improve as anticipated.  I discussed the assessment and treatment plan with the patient. The patient was provided an opportunity to ask questions and all were answered. The patient agreed with the plan and demonstrated an understanding of the instructions.  I, Mardene Speak, PA-C have reviewed all documentation for this visit. The documentation on  06/13/22 for the exam, diagnosis, procedures, and orders are all accurate and complete.  Mardene Speak, [facility], [place] [phone] (phone) [phone] (fax)   [place]

## 2022-06-15 NOTE — Progress Notes (Signed)
Please, let pt know that her mammogram showed no evidence of malignancy. Repeat in a year.

## 2022-06-24 ENCOUNTER — Other Ambulatory Visit: Payer: Self-pay | Admitting: Physician Assistant

## 2022-06-24 DIAGNOSIS — I10 Essential (primary) hypertension: Secondary | ICD-10-CM

## 2022-06-26 NOTE — Telephone Encounter (Signed)
Unable to refill per protocol, Rx expired.request is too soon. Last refill 10/01/21 for 90 and 3 refills.  Requested Prescriptions  Pending Prescriptions Disp Refills   metoprolol succinate (TOPROL-XL) 100 MG 24 hr tablet [Pharmacy Med Name: METOPROLOL ER SUCCINATE 100MG  TABS] 90 tablet 0    Sig: TAKE 1 TABLET BY MOUTH EVERY DAY WITH MEAL     Cardiovascular:  Beta Blockers Passed - 06/24/2022  8:00 AM      Passed - Last BP in normal range    BP Readings from Last 1 Encounters:  06/13/22 130/79         Passed - Last Heart Rate in normal range    Pulse Readings from Last 1 Encounters:  06/13/22 66         Passed - Valid encounter within last 6 months    Recent Outpatient Visits           1 week ago Irritable bowel syndrome, unspecified type   [place] [facility] [place], [place], PA-C   6 months ago Gastroesophageal reflux disease with esophagitis without hemorrhage   [facility] [place] [place], [place], PA-C   7 months ago Hypertension, unspecified type   [facility] [place], [place], PA-C   7 months ago Abnormal heart rate   [place] [facility] [place], [place], PA-C   9 months ago Essential hypertension   [facility] [place], [place], [place]

## 2022-07-16 ENCOUNTER — Other Ambulatory Visit: Payer: Self-pay | Admitting: Physician Assistant

## 2022-07-16 DIAGNOSIS — I10 Essential (primary) hypertension: Secondary | ICD-10-CM

## 2022-07-17 NOTE — Telephone Encounter (Signed)
Requested Prescriptions  Pending Prescriptions Disp Refills   amLODipine (NORVASC) 5 MG tablet [Pharmacy Med Name: AMLODIPINE BESYLATE 5 MG TAB] 90 tablet 0    Sig: TAKE 1 TABLET (5 MG TOTAL) BY MOUTH DAILY. (MUSTGET MAIL ORDER)     Cardiovascular: Calcium Channel Blockers 2 Passed - 07/16/2022  1:45 AM      Passed - Last BP in normal range    BP Readings from Last 1 Encounters:  06/13/22 130/79         Passed - Last Heart Rate in normal range    Pulse Readings from Last 1 Encounters:  06/13/22 66         Passed - Valid encounter within last 6 months    Recent Outpatient Visits           1 month ago Irritable bowel syndrome, unspecified type   [place] [facility] [place], [place], PA-C   6 months ago Gastroesophageal reflux disease with esophagitis without hemorrhage   [place] [facility] [place], Selby, PA-C   7 months ago Hypertension, unspecified type   [facility] [place], [place], PA-C   8 months ago Abnormal heart rate   [place] [facility] [place], [place], PA-C   9 months ago Essential hypertension    [facility] [place], [place], [place]

## 2022-07-19 ENCOUNTER — Other Ambulatory Visit: Payer: Self-pay | Admitting: Physician Assistant

## 2022-07-19 DIAGNOSIS — I10 Essential (primary) hypertension: Secondary | ICD-10-CM

## 2022-07-19 MED ORDER — AMLODIPINE BESYLATE 5 MG PO TABS
ORAL_TABLET | ORAL | 1 refills | Status: DC
Start: 2022-07-19 — End: 2023-01-23

## 2022-07-19 MED ORDER — METOPROLOL SUCCINATE ER 25 MG PO TB24
25.0000 mg | ORAL_TABLET | Freq: Every day | ORAL | 1 refills | Status: DC
Start: 2022-07-19 — End: 2023-01-25

## 2022-07-19 NOTE — Telephone Encounter (Signed)
Requested Prescriptions  Pending Prescriptions Disp Refills   amLODipine (NORVASC) 5 MG tablet 90 tablet 1    Sig: TAKE 1 TABLET (5 MG TOTAL) BY MOUTH DAILY. (MUSTGET MAIL ORDER)     Cardiovascular: Calcium Channel Blockers 2 Passed - 07/19/2022 10:47 AM      Passed - Last BP in normal range    BP Readings from Last 1 Encounters:  06/13/22 130/79         Passed - Last Heart Rate in normal range    Pulse Readings from Last 1 Encounters:  06/13/22 66         Passed - Valid encounter within last 6 months    Recent Outpatient Visits           1 month ago Irritable bowel syndrome, unspecified type   [place] [facility] [place], [place], PA-C   6 months ago Gastroesophageal reflux disease with esophagitis without hemorrhage   [facility] [place], [place], PA-C   7 months ago Hypertension, unspecified type   [place] [facility] [place], [place], PA-C   8 months ago Abnormal heart rate   [place] [facility] [place], [place], PA-C   9 months ago Essential hypertension   [facility] [place], Marcola, PA-C               metoprolol succinate (TOPROL-XL) 25 MG 24 hr tablet 90 tablet 1    Sig: Take 1 tablet (25 mg total) by mouth daily.     Cardiovascular:  Beta Blockers Passed - 07/19/2022 10:47 AM      Passed - Last BP in normal range    BP Readings from Last 1 Encounters:  06/13/22 130/79         Passed - Last Heart Rate in normal range    Pulse Readings from Last 1 Encounters:  06/13/22 66         Passed - Valid encounter within last 6 months    Recent Outpatient Visits           1 month ago Irritable bowel syndrome, unspecified type   [place] [facility] [place], [place], PA-C   6 months ago Gastroesophageal reflux disease with esophagitis without hemorrhage   [place] [facility] [place], Blanchard, PA-C   7 months ago Hypertension,  unspecified type   [facility] [place], [place], PA-C   8 months ago Abnormal heart rate   [place] [facility] [place], [place], PA-C   9 months ago Essential hypertension   [place] [facility] [place], [place], [place]

## 2022-07-19 NOTE — Telephone Encounter (Signed)
Copied from CRM [id number]. Topic: General - Other >> Jul 19, 2022  9:15 AM Everette C wrote: Reason for CRM: Medication Refill - Medication: amLODipine (NORVASC) 5 MG tablet [[id number]]  metoprolol succinate (TOPROL-XL) 25 MG 24 hr tablet [[id number]]  Has the patient contacted their pharmacy? Yes.   (Agent: If no, request that the patient contact the pharmacy for the refill. If patient does not wish to contact the pharmacy document the reason why and proceed with request.) (Agent: If yes, when and what did the pharmacy advise?)  Preferred Pharmacy (with phone number or street name): [id number] - [place], [place] - [address] AT [facility] OF [address] [facility] [address] [place] [address] Phone: [phone] Fax: [phone] Hours: Not open 24 hours   Has the patient been seen for an appointment in the last year OR does the patient have an upcoming appointment? Yes.    Agent: Please be advised that RX refills may take up to 3 business days. We ask that you follow-up with your pharmacy.

## 2022-09-24 ENCOUNTER — Other Ambulatory Visit: Payer: Self-pay | Admitting: Physician Assistant

## 2022-09-24 DIAGNOSIS — I10 Essential (primary) hypertension: Secondary | ICD-10-CM

## 2022-09-24 MED ORDER — METOPROLOL SUCCINATE ER 100 MG PO TB24
ORAL_TABLET | ORAL | 0 refills | Status: AC
Start: 2022-09-24 — End: ?

## 2022-09-24 NOTE — Telephone Encounter (Signed)
Requested Prescriptions  Pending Prescriptions Disp Refills   metoprolol succinate (TOPROL-XL) 100 MG 24 hr tablet [Pharmacy Med Name: METOPROLOL ER SUCCINATE 100MG  TABS] 90 tablet 0    Sig: TAKE 1 TABLET BY MOUTH EVERY DAY WITH MEAL     Cardiovascular:  Beta Blockers Passed - 09/24/2022  8:06 AM      Passed - Last BP in normal range    BP Readings from Last 1 Encounters:  06/13/22 130/79         Passed - Last Heart Rate in normal range    Pulse Readings from Last 1 Encounters:  06/13/22 66         Passed - Valid encounter within last 6 months    Recent Outpatient Visits           3 months ago Irritable bowel syndrome, unspecified type   [place] [facility] [place], [place], PA-C   9 months ago Gastroesophageal reflux disease with esophagitis without hemorrhage   [place] [facility] [place], [place], PA-C   10 months ago Hypertension, unspecified type   [place] [facility] [place], [place], PA-C   10 months ago Abnormal heart rate   [place] [facility] [place], [place], PA-C   12 months ago Essential hypertension   [place] [facility] [place], [place], PA-C               lisinopril-hydrochlorothiazide (ZESTORETIC) 20-12.5 MG tablet [Pharmacy Med Name: LISINOPRIL-HCTZ 20/12.5MG  TABLETS] 90 tablet 0    Sig: TAKE 1 TABLET BY MOUTH EVERY DAY     Cardiovascular:  ACEI + Diuretic Combos Failed - 09/24/2022  8:06 AM      Failed - Na in normal range and within 180 days    Sodium  Date Value Ref Range Status  11/14/2021 140 135 - 145 mmol/L Final  07/18/2021 140 134 - 144 mmol/L Final         Failed - K in normal range and within 180 days    Potassium  Date Value Ref Range Status  11/14/2021 3.6 3.5 - 5.1 mmol/L Final         Failed - Cr in normal range and within 180 days    Creatinine, Ser  Date Value Ref Range Status  11/14/2021 0.78 0.44 - 1.00 mg/dL Final         Failed - eGFR is  30 or above and within 180 days    GFR calc Af Amer  Date Value Ref Range Status  02/23/2020 93 >59 mL/min/1.73 Final    Comment:    **In accordance with recommendations from the NKF-ASN Task force,**   [facility] is in the process of updating its eGFR calculation to the   2021 CKD-EPI creatinine equation that estimates kidney function   without a race variable.    GFR, Estimated  Date Value Ref Range Status  11/14/2021 >60 >60 mL/min Final    Comment:    (NOTE) Calculated using the CKD-EPI Creatinine Equation (2021)    eGFR  Date Value Ref Range Status  07/18/2021 90 >59 mL/min/1.73 Final         Passed - Patient is not pregnant      Passed - Last BP in normal range    BP Readings from Last 1 Encounters:  06/13/22 130/79         Passed - Valid encounter within last 6 months    Recent Outpatient Visits  3 months ago Irritable bowel syndrome, unspecified type   [place] [facility] [place], [place], [place]   9 months ago Gastroesophageal reflux disease with esophagitis without hemorrhage   [place] [facility] [place], [place], PA-C   10 months ago Hypertension, unspecified type   [facility] [place], [place], PA-C   10 months ago Abnormal heart rate   [facility] [place], [place], [place]   12 months ago Essential hypertension   [place] [place], [place], [place]

## 2022-09-24 NOTE — Telephone Encounter (Signed)
Requested Prescriptions  Pending Prescriptions Disp Refills   metoprolol succinate (TOPROL-XL) 100 MG 24 hr tablet 90 tablet 0    Sig: TAKE 1 TABLET BY MOUTH EVERY DAY WITH MEAL     Cardiovascular:  Beta Blockers Passed - 09/24/2022  5:01 PM      Passed - Last BP in normal range    BP Readings from Last 1 Encounters:  06/13/22 130/79         Passed - Last Heart Rate in normal range    Pulse Readings from Last 1 Encounters:  06/13/22 66         Passed - Valid encounter within last 6 months    Recent Outpatient Visits           3 months ago Irritable bowel syndrome, unspecified type   [place] [facility] [place], [place], PA-C   9 months ago Gastroesophageal reflux disease with esophagitis without hemorrhage   [facility] [place], [place], PA-C   10 months ago Hypertension, unspecified type   [facility] [place], [place], PA-C   10 months ago Abnormal heart rate   [place] [facility] [place], [place], [place]   12 months ago Essential hypertension   [place] [facility] [place], [place], [place]

## 2022-09-24 NOTE — Telephone Encounter (Signed)
Medication Refill -metoprolol succinate (TOPROL-XL) 25 MG  Pt states has 2 pills left of this med, was told by pharmacy it was denied, I'm not showing denial, just says no refills  Has the patient contacted their pharmacy? yes (Agent: If no, request that the patient contact the pharmacy for the refill. If patient does not wish to contact the pharmacy document the reason why and proceed with request.) (Agent: If yes, when and what did the pharmacy advise?)  Preferred Pharmacy (with phone number or street name):  [id number] [place], [place] - [address] AT [facility] [address] [place], [place] [address] Phone: [phone]  Fax: [phone]   Has the patient been seen for an appointment in the last year OR does the patient have an upcoming appointment? yes  Agent: Please be advised that RX refills may take up to 3 business days. We ask that you follow-up with your pharmacy.

## 2022-09-27 ENCOUNTER — Other Ambulatory Visit: Payer: Self-pay | Admitting: Physician Assistant

## 2022-09-27 DIAGNOSIS — I10 Essential (primary) hypertension: Secondary | ICD-10-CM

## 2022-09-27 NOTE — Telephone Encounter (Signed)
Rx 09/24/22 #90- duplicate request- already filled 09/24/22

## 2022-12-20 ENCOUNTER — Other Ambulatory Visit: Payer: Self-pay | Admitting: Physician Assistant

## 2022-12-20 DIAGNOSIS — I10 Essential (primary) hypertension: Secondary | ICD-10-CM

## 2022-12-20 MED ORDER — METOPROLOL SUCCINATE ER 100 MG PO TB24: ORAL_TABLET | ORAL | 0 refills | Status: DC

## 2022-12-20 NOTE — Telephone Encounter (Signed)
Requested medication (s) are due for refill today: Yes  Requested medication (s) are on the active medication list: Yes  Last refill:  09/24/22, #90, 0RF  Future visit scheduled: No  Notes to clinic:  Unable to refill per protocol, appointment needed.      Requested Prescriptions  Pending Prescriptions Disp Refills   metoprolol succinate (TOPROL-XL) 100 MG 24 hr tablet 90 tablet 0    Sig: TAKE 1 TABLET BY MOUTH EVERY DAY WITH MEAL     Cardiovascular:  Beta Blockers Failed - 12/20/2022 11:56 AM      Failed - Valid encounter within last 6 months    Recent Outpatient Visits           6 months ago Irritable bowel syndrome, unspecified type   [place] [facility] [place], [place], PA-C   11 months ago Gastroesophageal reflux disease with esophagitis without hemorrhage   [place] [facility] [place], [place], PA-C   1 year ago Hypertension, unspecified type   [place] [facility] [place], [place], PA-C   1 year ago Abnormal heart rate   [place] [facility] [place], [place], PA-C   1 year ago Essential hypertension   [place] [facility] [place], [place], [place]              Passed - Last BP in normal range    BP Readings from Last 1 Encounters:  06/13/22 130/79         Passed - Last Heart Rate in normal range    Pulse Readings from Last 1 Encounters:  06/13/22 66

## 2022-12-20 NOTE — Telephone Encounter (Signed)
Medication Refill - Medication: metoprolol succinate (TOPROL-XL) 100 MG 24 hr tablet   Has the patient contacted their pharmacy? No   Preferred Pharmacy (with phone number or street name):  [id number] - [place], [place] - [address] AT [facility] OF [address] Phone: [phone]  Fax: [phone]      Has the patient been seen for an appointment in the last year OR does the patient have an upcoming appointment? Yes.    Patient assist patient further

## 2023-01-23 ENCOUNTER — Other Ambulatory Visit: Payer: Self-pay | Admitting: Physician Assistant

## 2023-01-23 DIAGNOSIS — I10 Essential (primary) hypertension: Secondary | ICD-10-CM

## 2023-01-24 ENCOUNTER — Other Ambulatory Visit: Payer: Self-pay | Admitting: Physician Assistant

## 2023-01-24 DIAGNOSIS — I10 Essential (primary) hypertension: Secondary | ICD-10-CM

## 2023-01-25 ENCOUNTER — Other Ambulatory Visit: Payer: Self-pay | Admitting: Physician Assistant

## 2023-01-25 DIAGNOSIS — I10 Essential (primary) hypertension: Secondary | ICD-10-CM

## 2023-01-25 NOTE — Telephone Encounter (Signed)
OV needed for additional refills, 30 day supply given until OV can be made.  Requested Prescriptions  Pending Prescriptions Disp Refills   metoprolol succinate (TOPROL-XL) 25 MG 24 hr tablet [Pharmacy Med Name: METOPROLOL ER SUCCINATE 25MG  TABS] 30 tablet 0    Sig: TAKE 1 TABLET(25 MG) BY MOUTH DAILY     Cardiovascular:  Beta Blockers Failed - 01/24/2023  2:02 PM      Failed - Valid encounter within last 6 months    Recent Outpatient Visits           7 months ago Irritable bowel syndrome, unspecified type   [place] [facility] [place], [place], PA-C   1 year ago Gastroesophageal reflux disease with esophagitis without hemorrhage   [place] [facility] [place], [place], PA-C   1 year ago Hypertension, unspecified type   [place] [facility] [place], [place], PA-C   1 year ago Abnormal heart rate   [facility] [place], [place], PA-C   1 year ago Essential hypertension   [facility] [place], [place], [place]              Passed - Last BP in normal range    BP Readings from Last 1 Encounters:  06/13/22 130/79         Passed - Last Heart Rate in normal range    Pulse Readings from Last 1 Encounters:  06/13/22 66

## 2023-03-12 ENCOUNTER — Other Ambulatory Visit: Payer: Self-pay | Admitting: Physician Assistant

## 2023-03-12 DIAGNOSIS — I10 Essential (primary) hypertension: Secondary | ICD-10-CM

## 2023-03-12 NOTE — Telephone Encounter (Signed)
Requested medication (s) are due for refill today: yes  Requested medication (s) are on the active medication list: yes  Last refill:  09/24/22 #90  Future visit scheduled: no  Notes to clinic:  overdue lab work    Requested Prescriptions  Pending Prescriptions Disp Refills   lisinopril-hydrochlorothiazide (ZESTORETIC) 20-12.5 MG tablet [Pharmacy Med Name: LISINOPRIL-HCTZ 20/12.5MG  TABLETS] 90 tablet 0    Sig: TAKE 1 TABLET BY MOUTH EVERY DAY     Cardiovascular:  ACEI + Diuretic Combos Failed - 03/12/2023  3:59 PM      Failed - Na in normal range and within 180 days    Sodium  Date Value Ref Range Status  11/14/2021 140 135 - 145 mmol/L Final  07/18/2021 140 134 - 144 mmol/L Final         Failed - K in normal range and within 180 days    Potassium  Date Value Ref Range Status  11/14/2021 3.6 3.5 - 5.1 mmol/L Final         Failed - Cr in normal range and within 180 days    Creatinine, Ser  Date Value Ref Range Status  11/14/2021 0.78 0.44 - 1.00 mg/dL Final         Failed - eGFR is 30 or above and within 180 days    GFR calc Af Amer  Date Value Ref Range Status  02/23/2020 93 >59 mL/min/1.73 Final    Comment:    **In accordance with recommendations from the NKF-ASN Task force,**   [facility] is in the process of updating its eGFR calculation to the   2021 CKD-EPI creatinine equation that estimates kidney function   without a race variable.    GFR, Estimated  Date Value Ref Range Status  11/14/2021 >60 >60 mL/min Final    Comment:    (NOTE) Calculated using the CKD-EPI Creatinine Equation (2021)    eGFR  Date Value Ref Range Status  07/18/2021 90 >59 mL/min/1.73 Final         Failed - Valid encounter within last 6 months    Recent Outpatient Visits           9 months ago Irritable bowel syndrome, unspecified type   [place] [facility] [place], [place], PA-C   1 year ago Gastroesophageal reflux disease with esophagitis without hemorrhage    [place] [place], [place], PA-C   1 year ago Hypertension, unspecified type   [place] [facility] [place], [place], PA-C   1 year ago Abnormal heart rate   [place] [facility] [place], [place], PA-C   1 year ago Essential hypertension   [place] [facility] [place], [place], [place]              Passed - Patient is not pregnant      Passed - Last BP in normal range    BP Readings from Last 1 Encounters:  06/13/22 130/79

## 2023-03-14 ENCOUNTER — Other Ambulatory Visit: Payer: Self-pay | Admitting: Family Medicine

## 2023-03-14 DIAGNOSIS — I10 Essential (primary) hypertension: Secondary | ICD-10-CM

## 2023-03-25 ENCOUNTER — Other Ambulatory Visit: Payer: Self-pay | Admitting: Family Medicine

## 2023-03-25 DIAGNOSIS — I10 Essential (primary) hypertension: Secondary | ICD-10-CM

## 2023-04-15 ENCOUNTER — Other Ambulatory Visit: Payer: Self-pay | Admitting: Physician Assistant

## 2023-04-15 DIAGNOSIS — I10 Essential (primary) hypertension: Secondary | ICD-10-CM

## 2023-04-17 NOTE — Telephone Encounter (Signed)
Requested medication (s) are due for refill today: yes  Requested medication (s) are on the active medication list: yes  Last refill:  03/14/23  Future visit scheduled: no  Notes to clinic:  Unable to refill per protocol, courtesy refill already given, routing for provider approval.Lab work needed      Requested Prescriptions  Pending Prescriptions Disp Refills   lisinopril-hydrochlorothiazide (ZESTORETIC) 20-12.5 MG tablet [Pharmacy Med Name: LISINOPRIL-HCTZ 20/12.5MG  TABLETS] 90 tablet     Sig: TAKE 1 TABLET BY MOUTH EVERY DAY     Cardiovascular:  ACEI + Diuretic Combos Failed - 04/17/2023  3:48 PM      Failed - Na in normal range and within 180 days    Sodium  Date Value Ref Range Status  11/14/2021 140 135 - 145 mmol/L Final  07/18/2021 140 134 - 144 mmol/L Final         Failed - K in normal range and within 180 days    Potassium  Date Value Ref Range Status  11/14/2021 3.6 3.5 - 5.1 mmol/L Final         Failed - Cr in normal range and within 180 days    Creatinine, Ser  Date Value Ref Range Status  11/14/2021 0.78 0.44 - 1.00 mg/dL Final         Failed - eGFR is 30 or above and within 180 days    GFR calc Af Amer  Date Value Ref Range Status  02/23/2020 93 >59 mL/min/1.73 Final    Comment:    **In accordance with recommendations from the NKF-ASN Task force,**   [facility] is in the process of updating its eGFR calculation to the   2021 CKD-EPI creatinine equation that estimates kidney function   without a race variable.    GFR, Estimated  Date Value Ref Range Status  11/14/2021 >60 >60 mL/min Final    Comment:    (NOTE) Calculated using the CKD-EPI Creatinine Equation (2021)    eGFR  Date Value Ref Range Status  07/18/2021 90 >59 mL/min/1.73 Final         Failed - Valid encounter within last 6 months    Recent Outpatient Visits           10 months ago Irritable bowel syndrome, unspecified type   [place] [facility] [place],  [place], PA-C   1 year ago Gastroesophageal reflux disease with esophagitis without hemorrhage   [place] [facility] [place], [place], PA-C   1 year ago Hypertension, unspecified type   [facility] [place], [place], PA-C   1 year ago Abnormal heart rate    [facility] [place], [place], PA-C   1 year ago Essential hypertension    [facility] [place], [place], [place]              Passed - Patient is not pregnant      Passed - Last BP in normal range    BP Readings from Last 1 Encounters:  06/13/22 130/79

## 2023-04-22 ENCOUNTER — Other Ambulatory Visit: Payer: Self-pay

## 2023-04-22 ENCOUNTER — Encounter: Payer: Self-pay | Admitting: Emergency Medicine

## 2023-04-22 ENCOUNTER — Emergency Department
Admission: EM | Admit: 2023-04-22 | Discharge: 2023-04-22 | Disposition: A | Discharge status: Home / Self Care | Payer: Managed Care, Other (non HMO) | Attending: Emergency Medicine | Admitting: Emergency Medicine

## 2023-04-22 ENCOUNTER — Emergency Department: Payer: Managed Care, Other (non HMO)

## 2023-04-22 DIAGNOSIS — R079 Chest pain, unspecified: Secondary | ICD-10-CM | POA: Insufficient documentation

## 2023-04-22 DIAGNOSIS — I10 Essential (primary) hypertension: Secondary | ICD-10-CM | POA: Insufficient documentation

## 2023-04-22 LAB — BASIC METABOLIC PANEL
Anion gap: 14 (ref 5–15)
BUN: 20 mg/dL (ref 6–20)
CO2: 22 mmol/L (ref 22–32)
Calcium: 9.2 mg/dL (ref 8.9–10.3)
Chloride: 102 mmol/L (ref 98–111)
Creatinine, Ser: 0.83 mg/dL (ref 0.44–1.00)
GFR, Estimated: >60 mL/min (ref >60)
Glucose, Bld: 104 mg/dL — ABNORMAL HIGH (ref 70–99)
Potassium: 3.7 mmol/L (ref 3.5–5.1)
Sodium: 138 mmol/L (ref 135–145)

## 2023-04-22 LAB — CBC
HCT: 41.8 % (ref 36.0–46.0)
Hemoglobin: 13.8 g/dL (ref 12.0–15.0)
MCH: 29.3 pg (ref 26.0–34.0)
MCHC: 33 g/dL (ref 30.0–36.0)
MCV: 88.7 fL (ref 80.0–100.0)
Platelets: 284 10*3/uL (ref 150–400)
RBC: 4.71 MIL/uL (ref 3.87–5.11)
RDW: 13.3 % (ref 11.5–15.5)
WBC: 7.3 10*3/uL (ref 4.0–10.5)
nRBC: 0 % (ref 0.0–0.2)

## 2023-04-22 LAB — TROPONIN I (HIGH SENSITIVITY): Troponin I (High Sensitivity): 2 ng/L (ref <18)

## 2023-04-22 MED ORDER — PANTOPRAZOLE SODIUM 40 MG PO TBEC: 40.0000 mg | DELAYED_RELEASE_TABLET | Freq: Every day | ORAL | 1 refills | Status: AC

## 2023-04-22 NOTE — Discharge Instructions (Addendum)
As we discussed please take Protonix daily for the next 2 months.  Please use over-the-counter Maalox before going to bed for the next 1 week.  Avoid any spicy foods alcohol or significant amount of NSAIDs (Motrin/ibuprofen/aspirin) for the next 2 weeks.  If you continue to have discomfort please call the number provided for cardiology to arrange a follow-up appointment for consideration of a stress test.  Return to the emergency department for any worsening symptoms any shortness of breath or any other symptom personally concerning to yourself.

## 2023-04-22 NOTE — ED Triage Notes (Signed)
Patient presents via POV with c/o dull center chest pain that has been ongoing for 3 weeks. Patient denies shortness of breath, nausea, vomiting. Denies any recent injuries or illness.

## 2023-04-22 NOTE — ED Provider Notes (Signed)
[facility] Provider Note    Event Date/Time   First MD Initiated Contact with Patient 04/22/23 1231     (approximate)  History   Chief Complaint: Chest Pain  HPI  Anna [name] is a 53 y.o. female with a past medical history of hypertension who presents to the emergency department for chest discomfort.  According to the patient for the past 3 weeks or so she has been experiencing intermittent dull discomfort to the center of her chest.  Patient denies any worsening with deep breathing coughing or movement.  Patient states it was somewhat worse today does admit to eating quite a bit of spicy food yesterday.  Denies any known reflux.  No personal history of cardiac disease.  No family history of cardiac disease.  Patient states very minimal discomfort currently.  Physical Exam   Triage Vital Signs: ED Triage Vitals  Encounter Vitals Group     BP 04/22/23 1021 (!) 140/85     Systolic BP Percentile --      Diastolic BP Percentile --      Pulse Rate 04/22/23 1021 64     Resp 04/22/23 1021 16     Temp 04/22/23 1021 98.5 F (36.9 C)     Temp Source 04/22/23 1021 Oral     SpO2 04/22/23 1021 95 %     Weight 04/22/23 1021 218 lb (98.9 kg)     Height 04/22/23 1021 5\' 2"  (1.575 m)     Head Circumference --      Peak Flow --      Pain Score 04/22/23 1032 6     Pain Loc --      Pain Education --      Exclude from Growth Chart --     Most recent vital signs: Vitals:   04/22/23 1021  BP: (!) 140/85  Pulse: 64  Resp: 16  Temp: 98.5 F (36.9 C)  SpO2: 95%    General: Awake, no distress.  CV:  Good peripheral perfusion.  Regular rate and rhythm  Resp:  Normal effort.  Equal breath sounds bilaterally.  Abd:  No distention.  Soft, nontender.  No rebound or guarding.  ED Results / Procedures / Treatments   EKG  EKG viewed and interpreted by myself shows a normal sinus rhythm at 68 bpm with a narrow QRS, normal axis, normal intervals, no concerning ST  changes.  RADIOLOGY  I have reviewed and interpreted chest x-ray images.  No consolidation on my evaluation. Radiology is read the x-ray is negative.   MEDICATIONS ORDERED IN ED: Medications - No data to display   IMPRESSION / MDM / ASSESSMENT AND PLAN / ED COURSE  I reviewed the triage vital signs and the nursing notes.  Patient's presentation is most consistent with acute presentation with potential threat to life or bodily function.  Patient presents to the emergency department for chest pain intermittent over the last 3 weeks.  Overall the patient appears well, reassuring physical exam, reassuring workup including a normal CBC, normal chemistry and negative troponin reassuring chest x-ray and EKG.  Patient describes the symptoms as being central in the chest going from the epigastrium into the middle of the chest.  Patient states seem to worsen today when she ate spicy food yesterday.  Given the patient's reassuring workup reassuring physical exam and reassuring vital signs I believe the patient is safe for discharge home with outpatient follow-up.  I did discuss with the patient a trial of  Protonix as well as Maalox to see if this helps with her discomfort.  I also discussed cardiology follow-up for consideration of a stress test if she continues to have symptoms.  Patient agreeable to plan of care and reassured by today's workup.  FINAL CLINICAL IMPRESSION(S) / ED DIAGNOSES   Chest pain   Note:  This document was prepared using Dragon voice recognition software and may include unintentional dictation errors.   Minna Antis, MD 04/22/23 1249

## 2023-04-22 NOTE — ED Notes (Signed)
See triage note  Presents with chest discomfort which started 3 weeks ago  Describes as dull pain  No fever or cough or trauma

## 2023-05-02 ENCOUNTER — Other Ambulatory Visit: Payer: Self-pay | Admitting: Physician Assistant

## 2023-05-02 DIAGNOSIS — I10 Essential (primary) hypertension: Secondary | ICD-10-CM

## 2023-05-03 NOTE — Telephone Encounter (Signed)
Requested Prescriptions  Pending Prescriptions Disp Refills   metoprolol succinate (TOPROL-XL) 25 MG 24 hr tablet [Pharmacy Med Name: METOPROLOL ER SUCCINATE 25MG  TABS] 90 tablet 0    Sig: TAKE 1 TABLET(25 MG) BY MOUTH DAILY     Cardiovascular:  Beta Blockers Failed - 05/03/2023  1:48 PM      Failed - Last BP in normal range    BP Readings from Last 1 Encounters:  04/22/23 (!) 140/85         Failed - Valid encounter within last 6 months    Recent Outpatient Visits           10 months ago Irritable bowel syndrome, unspecified type   [place] [facility] [place], [place], PA-C   1 year ago Gastroesophageal reflux disease with esophagitis without hemorrhage   [place] [facility] [place], [place], PA-C   1 year ago Hypertension, unspecified type   [facility] [place], [place], PA-C   1 year ago Abnormal heart rate   [place] [facility] [place], [place], PA-C   1 year ago Essential hypertension   [place] [facility] [place], [place], PA-C       Future Appointments             In 4 days [place], [place], PA-C [place] [facility]            Passed - Last Heart Rate in normal range    Pulse Readings from Last 1 Encounters:  04/22/23 64

## 2023-05-07 ENCOUNTER — Ambulatory Visit: Payer: Managed Care, Other (non HMO) | Admitting: Physician Assistant

## 2023-05-09 ENCOUNTER — Ambulatory Visit: Payer: Managed Care, Other (non HMO) | Admitting: Physician Assistant

## 2023-05-17 ENCOUNTER — Ambulatory Visit: Payer: Managed Care, Other (non HMO) | Admitting: Physician Assistant

## 2023-07-01 ENCOUNTER — Other Ambulatory Visit: Payer: Self-pay | Admitting: Physician Assistant

## 2023-07-01 DIAGNOSIS — I10 Essential (primary) hypertension: Secondary | ICD-10-CM

## 2023-07-02 ENCOUNTER — Other Ambulatory Visit: Payer: Self-pay

## 2023-07-02 DIAGNOSIS — I10 Essential (primary) hypertension: Secondary | ICD-10-CM

## 2023-07-02 MED ORDER — METOPROLOL SUCCINATE ER 100 MG PO TB24: ORAL_TABLET | ORAL | 0 refills | Status: DC

## 2023-07-02 MED ORDER — METOPROLOL SUCCINATE ER 25 MG PO TB24: 25.0000 mg | ORAL_TABLET | Freq: Every day | ORAL | 0 refills | Status: DC

## 2023-07-02 MED ORDER — AMLODIPINE BESYLATE 5 MG PO TABS: ORAL_TABLET | ORAL | 1 refills | Status: DC

## 2023-07-02 NOTE — Telephone Encounter (Signed)
 Requested medications are due for refill today.  unsure  Requested medications are on the active medications list.  yes  Last refill. varied  Future visit scheduled.   no  Notes to clinic.  Pt last seen 3/127/2024. Pt is more than 3 months overdue for an ov. Pt has missed a few recent ovs. Pt has both 25 mg and 100 mg Metoprolol prescribed. Please review for refill.    Requested Prescriptions  Pending Prescriptions Disp Refills   amLODipine (NORVASC) 5 MG tablet [Pharmacy Med Name: AMLODIPINE BESYLATE 5MG  TABLETS] 90 tablet 1    Sig: TAKE 1 TABLET BY MOUTH EVERY DAY     Cardiovascular: Calcium Channel Blockers 2 Failed - 07/02/2023 12:17 PM      Failed - Last BP in normal range    BP Readings from Last 1 Encounters:  04/22/23 (!) 140/85         Failed - Valid encounter within last 6 months    Recent Outpatient Visits   None            Passed - Last Heart Rate in normal range    Pulse Readings from Last 1 Encounters:  04/22/23 64          metoprolol succinate (TOPROL-XL) 100 MG 24 hr tablet [Pharmacy Med Name: METOPROLOL ER SUCCINATE 100MG  TABS] 90 tablet 0    Sig: TAKE 1 TABLET BY MOUTH EVERY DAY WITH MEAL     Cardiovascular:  Beta Blockers Failed - 07/02/2023 12:17 PM      Failed - Last BP in normal range    BP Readings from Last 1 Encounters:  04/22/23 (!) 140/85         Failed - Valid encounter within last 6 months    Recent Outpatient Visits   None            Passed - Last Heart Rate in normal range    Pulse Readings from Last 1 Encounters:  04/22/23 64

## 2023-07-12 ENCOUNTER — Other Ambulatory Visit: Payer: Self-pay | Admitting: Physician Assistant

## 2023-07-12 DIAGNOSIS — I10 Essential (primary) hypertension: Secondary | ICD-10-CM

## 2023-07-12 NOTE — Telephone Encounter (Signed)
 Patient called and advised she's due for an appointment, she verbalized understanding. Scheduled CPE with provider in June. Advised refill will be sent.

## 2023-07-12 NOTE — Telephone Encounter (Signed)
 Requested medications are due for refill today.  yes  Requested medications are on the active medications list.  yes  Last refill. 04/18/2023 #90 0 rf  Future visit scheduled.   no  Notes to clinic.  Pt last seen in clinic 06/13/2022. Pt is more than 3 months overdue for an OV.     Requested Prescriptions  Pending Prescriptions Disp Refills   lisinopril -hydrochlorothiazide  (ZESTORETIC ) 20-12.5 MG tablet [Pharmacy Med Name: LISINOPRIL -HCTZ 20/12.5MG  TABLETS] 90 tablet 0    Sig: TAKE 1 TABLET BY MOUTH EVERY DAY     Cardiovascular:  ACEI + Diuretic Combos Failed - 07/12/2023  3:37 PM      Failed - Last BP in normal range    BP Readings from Last 1 Encounters:  04/22/23 (!) 140/85         Failed - Valid encounter within last 6 months    Recent Outpatient Visits   None            Passed - Na in normal range and within 180 days    Sodium  Date Value Ref Range Status  04/22/2023 138 135 - 145 mmol/L Final  07/18/2021 140 134 - 144 mmol/L Final         Passed - K in normal range and within 180 days    Potassium  Date Value Ref Range Status  04/22/2023 3.7 3.5 - 5.1 mmol/L Final         Passed - Cr in normal range and within 180 days    Creatinine, Ser  Date Value Ref Range Status  04/22/2023 0.83 0.44 - 1.00 mg/dL Final         Passed - eGFR is 30 or above and within 180 days    GFR calc Af Amer  Date Value Ref Range Status  02/23/2020 93 >59 mL/min/1.73 Final    Comment:    **In accordance with recommendations from the NKF-ASN Task force,**   [facility] is in the process of updating its eGFR calculation to the   2021 CKD-EPI creatinine equation that estimates kidney function   without a race variable.    GFR, Estimated  Date Value Ref Range Status  04/22/2023 >60 >60 mL/min Final    Comment:    (NOTE) Calculated using the CKD-EPI Creatinine Equation (2021)    eGFR  Date Value Ref Range Status  07/18/2021 90 >59 mL/min/1.73 Final         Passed - Patient  is not pregnant

## 2023-07-12 NOTE — Telephone Encounter (Signed)
 Copied from CRM [id number]. Topic: Clinical - Medication Refill >> Jul 12, 2023 10:29 AM Lizabeth Riggs wrote: Most Recent Primary Care Visit:  Provider: OSTWALT, JANNA  Department: [id number]  Visit Type: HOSPITAL FU  Date: 06/13/2022  Medication: lisinopril -hydrochlorothiazide  (ZESTORETIC ) 20-12.5 MG tablet  Has the patient contacted their pharmacy? Yes (Agent: If no, request that the patient contact the pharmacy for the refill. If patient does not wish to contact the pharmacy document the reason why and proceed with request.) (Agent: If yes, when and what did the pharmacy advise?) Pharmacy needs order to refill  Is this the correct pharmacy for this prescription? Yes If no, delete pharmacy and type the correct one.  This is the patient's preferred pharmacy:  [id number] - [place], [place] - [address] AT [facility] [address] [facility] [facility] [address] [place] [address] Phone: [phone] Fax: [phone]   Has the prescription been filled recently? No  Is the patient out of the medication? Yes - She ran out of medication today  Has the patient been seen for an appointment in the last year OR does the patient have an upcoming appointment? Yes  Can we respond through MyChart? Yes  Agent: Please be advised that Rx refills may take up to 3 business days. We ask that you follow-up with your pharmacy.

## 2023-07-12 NOTE — Telephone Encounter (Signed)
 Requested Prescriptions  Pending Prescriptions Disp Refills   lisinopril -hydrochlorothiazide  (ZESTORETIC ) 20-12.5 MG tablet [Pharmacy Med Name: LISINOPRIL -HCTZ 20/12.5MG  TABLETS] 90 tablet 0    Sig: TAKE 1 TABLET BY MOUTH EVERY DAY     Cardiovascular:  ACEI + Diuretic Combos Failed - 07/12/2023  4:19 PM      Failed - Last BP in normal range    BP Readings from Last 1 Encounters:  04/22/23 (!) 140/85         Failed - Valid encounter within last 6 months    Recent Outpatient Visits   None            Passed - Na in normal range and within 180 days    Sodium  Date Value Ref Range Status  04/22/2023 138 135 - 145 mmol/L Final  07/18/2021 140 134 - 144 mmol/L Final         Passed - K in normal range and within 180 days    Potassium  Date Value Ref Range Status  04/22/2023 3.7 3.5 - 5.1 mmol/L Final         Passed - Cr in normal range and within 180 days    Creatinine, Ser  Date Value Ref Range Status  04/22/2023 0.83 0.44 - 1.00 mg/dL Final         Passed - eGFR is 30 or above and within 180 days    GFR calc Af Amer  Date Value Ref Range Status  02/23/2020 93 >59 mL/min/1.73 Final    Comment:    **In accordance with recommendations from the NKF-ASN Task force,**   [facility] is in the process of updating its eGFR calculation to the   2021 CKD-EPI creatinine equation that estimates kidney function   without a race variable.    GFR, Estimated  Date Value Ref Range Status  04/22/2023 >60 >60 mL/min Final    Comment:    (NOTE) Calculated using the CKD-EPI Creatinine Equation (2021)    eGFR  Date Value Ref Range Status  07/18/2021 90 >59 mL/min/1.73 Final         Passed - Patient is not pregnant

## 2023-08-20 ENCOUNTER — Encounter: Admitting: Physician Assistant

## 2023-08-22 NOTE — Progress Notes (Unsigned)
 Complete physical exam  Patient: Anna [name]   DOB: [birth year]   53 y.o. Female  MRN: [id number] Visit Date: 08/23/2023  Today's healthcare provider: Blane Bunting, PA-C   No chief complaint on file.  Subjective    Anna [name] is a 53 y.o. female who presents today for a complete physical exam.  She reports consuming a {diet types:17450} diet. {Exercise:19826} She generally feels {well/fairly well/poorly:18703}. She reports sleeping {well/fairly well/poorly:18703}. She {does/does not:200015} have additional problems to discuss today.  HPI  *** Discussed the use of AI scribe software for clinical note transcription with the patient, who gave verbal consent to proceed.  History of Present Illness     Last depression screening scores    11/14/2021    3:10 PM 07/18/2021    9:51 AM 03/01/2020    9:07 AM  PHQ 2/9 Scores  PHQ - 2 Score 2 2 2   PHQ- 9 Score 5 6    Last fall risk screening    11/14/2021    3:10 PM  Fall Risk   Falls in the past year? 0  Number falls in past yr: 0  Injury with Fall? 0  Risk for fall due to : No Fall Risks  Follow up Falls evaluation completed   Last Audit-C alcohol use screening    11/14/2021    3:10 PM  Alcohol Use Disorder Test (AUDIT)  1. How often do you have a drink containing alcohol? 1  2. How many drinks containing alcohol do you have on a typical day when you are drinking? 0  3. How often do you have six or more drinks on one occasion? 0  AUDIT-C Score 1   A score of 3 or more in women, and 4 or more in men indicates increased risk for alcohol abuse, EXCEPT if all of the points are from question 1   Past Medical History:  Diagnosis Date  . Hypertension    Past Surgical History:  Procedure Laterality Date  . COLONOSCOPY WITH PROPOFOL  N/A 09/15/2021   Procedure: COLONOSCOPY WITH PROPOFOL ;  Surgeon: Selena Daily, MD;  Location: [facility];  Service: Gastroenterology;  Laterality: N/A;  . FINGER ARTHROSCOPY WITH  EXTENSIVE DEBRIDEMENT Right 12/04/2017   Procedure: DEBRIDEMENT WITH BONE GRAFTING OF A PRESUMED ENCHONDROMA OF THE RIGHT LING MIDDLE PHALANX;  Surgeon: Elner Hahn, MD;  Location: [facility];  Service: Orthopedics;  Laterality: Right;  CANCELLOUS CHIPS AND DEMINERALIZED BONE  MATRIX PUTTY AVAILABLE FOR GRAFTING VERY SMALL OSTEOTOMES AND CURETTES  . LAPAROSCOPIC APPENDECTOMY N/A 09/22/2017   Procedure: APPENDECTOMY LAPAROSCOPIC;  Surgeon: Franki Isles, MD;  Location: [facility] ORS;  Service: General;  Laterality: N/A;   Social History   Socioeconomic History  . Marital status: Single    Spouse name: Not on file  . Number of children: Not on file  . Years of education: Not on file  . Highest education level: Not on file  Occupational History    Employer: [employer]  Tobacco Use  . Smoking status: Never  . Smokeless tobacco: Never  Vaping Use  . Vaping status: Never Used  Substance and Sexual Activity  . Alcohol use: Yes    Alcohol/week: 0.0 standard drinks of alcohol    Comment: OCCASIONALLY (3-4x/yr)  . Drug use: No  . Sexual activity: Not on file  Other Topics Concern  . Not on file  Social History Narrative  . Not on file   Social Drivers of Health  Financial Resource Strain: Not on file  Food Insecurity: Not on file  Transportation Needs: Not on file  Physical Activity: Not on file  Stress: Not on file  Social Connections: Not on file  Intimate Partner Violence: Not on file   Family Status  Relation Name Status  . Mother  Deceased  . Father  Deceased at age 32       passed January 2021  . Sister 1 Alive  . Sister 2 Alive  . [employer]  . MGM  Deceased  . MGF  Deceased  . PGM  Deceased  . PGF  Deceased  No partnership data on file   Family History  Problem Relation Age of Onset  . Stroke Mother   . Hypertension Mother   . Heart attack Mother   . Breast cancer Mother 45  . Colon cancer Father   . COPD Father   . Hypertension Father   .  Heart disease Father   . Hypertension Sister   . Fibroids Sister   . Obesity Sister   . Ovarian cancer Sister 40  . Obesity Sister   . Breast cancer Maternal Aunt    No Known Allergies  Patient Care Team: Neomi Laidler, PA-C as PCP - General (Physician Assistant)   Medications: Outpatient Medications Prior to Visit  Medication Sig  . amLODipine  (NORVASC ) 5 MG tablet TAKE 1 TABLET BY MOUTH EVERY DAY  . citalopram  (CELEXA ) 10 MG tablet TAKE 1 TABLET BY MOUTH EVERY DAY  . lisinopril -hydrochlorothiazide  (ZESTORETIC ) 20-12.5 MG tablet Take 1 tablet by mouth daily. OFFICE VISIT NEEDED FOR ADDITIONAL REFILLS  . metoprolol  succinate (TOPROL -XL) 100 MG 24 hr tablet TAKE 1 TABLET BY MOUTH EVERY DAY WITH MEAL  . metoprolol  succinate (TOPROL -XL) 25 MG 24 hr tablet Take 1 tablet (25 mg total) by mouth daily.  . pantoprazole  (PROTONIX ) 40 MG tablet Take 1 tablet (40 mg total) by mouth daily.  . Vitamin D , Ergocalciferol , (DRISDOL ) 1.25 MG (50000 UNIT) CAPS capsule Take 1 capsule (50,000 Units total) by mouth every 7 (seven) days.   No facility-administered medications prior to visit.    Review of Systems  All other systems reviewed and are negative. Except see HPI  {Insert previous labs (optional):23779} {See past labs  Heme  Chem  Endocrine  Serology  Results Review (optional):1}  Objective    LMP 03/19/2017 (Approximate) Comment: Preg Test Negative {Insert last BP/Wt (optional):23777}{See vitals history (optional):1}    Physical Exam Vitals reviewed.  Constitutional:      General: She is not in acute distress.    Appearance: Normal appearance. She is well-developed. She is not ill-appearing, toxic-appearing or diaphoretic.  HENT:     Head: Normocephalic and atraumatic.     Right Ear: Tympanic membrane, ear canal and external ear normal.     Left Ear: Tympanic membrane, ear canal and external ear normal.     Nose: Nose normal. No congestion or rhinorrhea.     Mouth/Throat:      Mouth: Mucous membranes are moist.     Pharynx: Oropharynx is clear. No oropharyngeal exudate.  Eyes:     General: No scleral icterus.       Right eye: No discharge.        Left eye: No discharge.     Conjunctiva/sclera: Conjunctivae normal.     Pupils: Pupils are equal, round, and reactive to light.  Neck:     Thyroid: No thyromegaly.     Vascular: No carotid bruit.  Cardiovascular:     Rate and Rhythm: Normal rate and regular rhythm.     Pulses: Normal pulses.     Heart sounds: Normal heart sounds. No murmur heard.    No friction rub. No gallop.  Pulmonary:     Effort: Pulmonary effort is normal. No respiratory distress.     Breath sounds: Normal breath sounds. No wheezing, rhonchi or rales.  Abdominal:     General: Abdomen is flat. Bowel sounds are normal. There is no distension.     Palpations: Abdomen is soft. There is no mass.     Tenderness: There is no abdominal tenderness. There is no right CVA tenderness, left CVA tenderness, guarding or rebound.     Hernia: No hernia is present.  Musculoskeletal:        General: No swelling, tenderness, deformity or signs of injury. Normal range of motion.     Cervical back: Normal range of motion and neck supple. No rigidity or tenderness.     Right lower leg: No edema.     Left lower leg: No edema.  Lymphadenopathy:     Cervical: No cervical adenopathy.  Skin:    General: Skin is warm and dry.     Coloration: Skin is not jaundiced or pale.     Findings: No bruising, erythema, lesion or rash.  Neurological:     Mental Status: She is alert and oriented to person, place, and time. Mental status is at baseline.     Gait: Gait normal.  Psychiatric:        Mood and Affect: Mood normal.        Behavior: Behavior normal.        Thought Content: Thought content normal.        Judgment: Judgment normal.     No results found for any visits on 08/23/23.  Assessment & Plan    Routine Health Maintenance and Physical  Exam  Exercise Activities and Dietary recommendations  Goals   None     Immunization History  Administered Date(s) Administered  . Influenza,inj,Quad PF,6+ Mos 01/12/2014  . Tdap 06/25/2006, 06/29/2008    Health Maintenance  Topic Date Due  . DTaP/Tdap/Td vaccine (3 - Td or Tdap) 06/30/2018  . Zoster (Shingles) Vaccine (1 of 2) Never done  . COVID-19 Vaccine (1 - 2024-25 season) Never done  . Mammogram  06/13/2023  . Flu Shot  10/18/2023  . Pap with HPV screening  11/05/2023  . Colon Cancer Screening  09/16/2031  . Hepatitis C Screening  Completed  . HIV Screening  Completed  . HPV Vaccine  Aged Out  . Meningitis B Vaccine  Aged Out    Discussed health benefits of physical activity, and encouraged her to engage in regular exercise appropriate for her age and condition.  Assessment and Plan Assessment & Plan      ***  No follow-ups on file.    The patient was advised to call back or seek an in-person evaluation if the symptoms worsen or if the condition fails to improve as anticipated.  I discussed the assessment and treatment plan with the patient. The patient was provided an opportunity to ask questions and all were answered. The patient agreed with the plan and demonstrated an understanding of the instructions.  I, Blondie Riggsbee, PA-C have reviewed all documentation for this visit. The documentation on 08/23/2023  for the exam, diagnosis, procedures, and orders are all accurate and complete.  Blane Bunting, [facility], MMS [facility] [phone] (  phone) [phone] (fax)  [facility]

## 2023-08-23 ENCOUNTER — Encounter: Payer: Self-pay | Admitting: Physician Assistant

## 2023-08-23 ENCOUNTER — Ambulatory Visit (INDEPENDENT_AMBULATORY_CARE_PROVIDER_SITE_OTHER): Admitting: Physician Assistant

## 2023-08-23 VITALS — BP 117/67 | HR 62 | Resp 16 | Ht 67.0 in | Wt 214.0 lb

## 2023-08-23 DIAGNOSIS — Z6838 Body mass index (BMI) 38.0-38.9, adult: Secondary | ICD-10-CM | POA: Diagnosis not present

## 2023-08-23 DIAGNOSIS — I10 Essential (primary) hypertension: Secondary | ICD-10-CM | POA: Diagnosis not present

## 2023-08-23 DIAGNOSIS — E66812 Obesity, class 2: Secondary | ICD-10-CM

## 2023-08-23 DIAGNOSIS — K2100 Gastro-esophageal reflux disease with esophagitis, without bleeding: Secondary | ICD-10-CM

## 2023-08-23 DIAGNOSIS — F419 Anxiety disorder, unspecified: Secondary | ICD-10-CM | POA: Diagnosis not present

## 2023-08-23 DIAGNOSIS — E7800 Pure hypercholesterolemia, unspecified: Secondary | ICD-10-CM

## 2023-08-23 DIAGNOSIS — Z0001 Encounter for general adult medical examination with abnormal findings: Secondary | ICD-10-CM | POA: Diagnosis not present

## 2023-08-23 DIAGNOSIS — R7303 Prediabetes: Secondary | ICD-10-CM

## 2023-08-23 DIAGNOSIS — Z0000 Encounter for general adult medical examination without abnormal findings: Secondary | ICD-10-CM | POA: Insufficient documentation

## 2023-08-26 ENCOUNTER — Other Ambulatory Visit: Payer: Self-pay | Admitting: Physician Assistant

## 2023-08-26 DIAGNOSIS — Z1231 Encounter for screening mammogram for malignant neoplasm of breast: Secondary | ICD-10-CM

## 2023-08-27 ENCOUNTER — Encounter: Payer: Self-pay | Admitting: Physician Assistant

## 2023-09-05 ENCOUNTER — Ambulatory Visit
Admission: RE | Admit: 2023-09-05 | Discharge: 2023-09-05 | Disposition: A | Discharge status: Home / Self Care | Source: Ambulatory Visit | Attending: Physician Assistant | Admitting: Physician Assistant

## 2023-09-05 DIAGNOSIS — Z1231 Encounter for screening mammogram for malignant neoplasm of breast: Secondary | ICD-10-CM | POA: Diagnosis present

## 2023-09-09 ENCOUNTER — Ambulatory Visit: Payer: Self-pay | Admitting: Physician Assistant

## 2023-10-12 ENCOUNTER — Other Ambulatory Visit: Payer: Self-pay | Admitting: Physician Assistant

## 2023-10-12 DIAGNOSIS — I10 Essential (primary) hypertension: Secondary | ICD-10-CM

## 2024-01-16 ENCOUNTER — Other Ambulatory Visit: Payer: Self-pay | Admitting: Physician Assistant

## 2024-01-16 DIAGNOSIS — I10 Essential (primary) hypertension: Secondary | ICD-10-CM

## 2024-01-16 NOTE — Telephone Encounter (Signed)
 Spoke with pt regarding pending blood work from June. Advised to have that drawn soon, was made aware of courtesy refill. Per provider not to evaluate if any changes are needed to be made. Pt stated she would stop by tmrw to complete.

## 2024-02-12 LAB — COMPREHENSIVE METABOLIC PANEL WITH GFR
ALT: 20 IU/L (ref 0–32)
AST: 19 IU/L (ref 0–40)
Albumin: 4.3 g/dL (ref 3.8–4.9)
Alkaline Phosphatase: 103 IU/L (ref 49–135)
BUN/Creatinine Ratio: 20 (ref 9–23)
BUN: 16 mg/dL (ref 6–24)
Bilirubin Total: 0.3 mg/dL (ref 0.0–1.2)
CO2: 26 mmol/L (ref 20–29)
Calcium: 9.5 mg/dL (ref 8.7–10.2)
Chloride: 100 mmol/L (ref 96–106)
Creatinine, Ser: 0.82 mg/dL (ref 0.57–1.00)
Globulin, Total: 3.1 g/dL (ref 1.5–4.5)
Glucose: 92 mg/dL (ref 70–99)
Potassium: 4.1 mmol/L (ref 3.5–5.2)
Sodium: 141 mmol/L (ref 134–144)
Total Protein: 7.4 g/dL (ref 6.0–8.5)
eGFR: 87 mL/min/1.73 (ref >59)

## 2024-02-12 LAB — LIPID PANEL
Chol/HDL Ratio: 6.6 ratio — ABNORMAL HIGH (ref 0.0–4.4)
Cholesterol, Total: 238 mg/dL — ABNORMAL HIGH (ref 100–199)
HDL: 36 mg/dL — ABNORMAL LOW (ref >39)
LDL Chol Calc (NIH): 161 mg/dL — ABNORMAL HIGH (ref 0–99)
Triglycerides: 219 mg/dL — ABNORMAL HIGH (ref 0–149)
VLDL Cholesterol Cal: 41 mg/dL — ABNORMAL HIGH (ref 5–40)

## 2024-02-12 LAB — CBC WITH DIFFERENTIAL/PLATELET
Basophils Absolute: 0.1 x10E3/uL (ref 0.0–0.2)
Basos: 1 %
EOS (ABSOLUTE): 0.1 x10E3/uL (ref 0.0–0.4)
Eos: 2 %
Hematocrit: 41.1 % (ref 34.0–46.6)
Hemoglobin: 13.3 g/dL (ref 11.1–15.9)
Immature Grans (Abs): 0 x10E3/uL (ref 0.0–0.1)
Immature Granulocytes: 0 %
Lymphocytes Absolute: 3.7 x10E3/uL — ABNORMAL HIGH (ref 0.7–3.1)
Lymphs: 64 %
MCH: 28.3 pg (ref 26.6–33.0)
MCHC: 32.4 g/dL (ref 31.5–35.7)
MCV: 87 fL (ref 79–97)
Monocytes Absolute: 0.4 x10E3/uL (ref 0.1–0.9)
Monocytes: 7 %
Neutrophils Absolute: 1.5 x10E3/uL (ref 1.4–7.0)
Neutrophils: 26 %
Platelets: 293 x10E3/uL (ref 150–450)
RBC: 4.7 x10E6/uL (ref 3.77–5.28)
RDW: 12.8 % (ref 11.7–15.4)
WBC: 5.7 x10E3/uL (ref 3.4–10.8)

## 2024-02-12 LAB — HEMOGLOBIN A1C
Est. average glucose Bld gHb Est-mCnc: 120 mg/dL
Hgb A1c MFr Bld: 5.8 % — ABNORMAL HIGH (ref 4.8–5.6)

## 2024-02-12 LAB — TSH: TSH: 2.03 u[IU]/mL (ref 0.450–4.500)

## 2024-02-14 ENCOUNTER — Ambulatory Visit: Payer: Self-pay | Admitting: Physician Assistant

## 2024-02-16 ENCOUNTER — Other Ambulatory Visit: Payer: Self-pay | Admitting: Physician Assistant

## 2024-02-16 DIAGNOSIS — I10 Essential (primary) hypertension: Secondary | ICD-10-CM

## 2024-02-18 NOTE — Telephone Encounter (Signed)
 Spoke with pt, pt stated she is all 4 medications. Lisinopril -hydrochlorothiazide  was put on at the hospital and has continue taking it, along with amlodipine  and Metoprolol  100mg  and 25mg . Pt stated she wasn't sure if the lisinopril  was temporary and if so it needs to be reassess. Please advise.

## 2024-02-20 ENCOUNTER — Ambulatory Visit: Admitting: Physician Assistant

## 2024-02-21 ENCOUNTER — Encounter: Payer: Self-pay | Admitting: Physician Assistant

## 2024-02-21 ENCOUNTER — Ambulatory Visit: Admitting: Physician Assistant

## 2024-02-21 VITALS — BP 134/79 | HR 61 | Temp 98.2°F | Ht 67.0 in | Wt 214.0 lb

## 2024-02-21 DIAGNOSIS — E7800 Pure hypercholesterolemia, unspecified: Secondary | ICD-10-CM

## 2024-02-21 DIAGNOSIS — R7303 Prediabetes: Secondary | ICD-10-CM | POA: Diagnosis not present

## 2024-02-21 DIAGNOSIS — E66812 Obesity, class 2: Secondary | ICD-10-CM

## 2024-02-21 DIAGNOSIS — I10 Essential (primary) hypertension: Secondary | ICD-10-CM

## 2024-02-21 DIAGNOSIS — Z6838 Body mass index (BMI) 38.0-38.9, adult: Secondary | ICD-10-CM

## 2024-02-21 DIAGNOSIS — K589 Irritable bowel syndrome without diarrhea: Secondary | ICD-10-CM | POA: Diagnosis not present

## 2024-02-21 MED ORDER — METFORMIN HCL ER 500 MG PO TB24: 500.0000 mg | ORAL_TABLET | Freq: Every day | ORAL | 2 refills | Status: AC

## 2024-02-21 NOTE — Progress Notes (Unsigned)
 Established patient visit  Patient: Anna [name]   DOB: Jan 10, 1971   53 y.o. Female  MRN: [id number] Visit Date: 02/21/2024  Today's healthcare provider: Jolynn Spencer, PA-C   Chief Complaint  Patient presents with   Follow-up    Patient is here for a follow up, reports that someone called her from here and stated that she needed to come in to get re-evaluated regarding her medications.  Declined all vaccines.   Subjective     Discussed the use of AI scribe software for clinical note transcription with the patient, who gave verbal consent to proceed.  History of Present Illness Anna [name] is a 53 year old female with hypertension who presents for weight management and blood pressure control.  She reports long-standing difficulty with weight loss and wants to lose weight to improve her health. She works from home and identifies stress eating as a major barrier despite exercising. She recently lost 6 pounds in the past and wants to try lifestyle changes again before enrolling in a structured program.  She takes amlodipine  5 mg, lisinopril , hydrochlorothiazide , and metoprolol  100 mg for hypertension. Her home blood pressure readings are often above 130. She recognizes that dietary salt and stress increase her blood pressure, especially around holidays, and she is motivated to reduce salt and choose healthier foods.  She has prediabetes with a recent hemoglobin A1c of 5.8%. She also has IBS and is concerned this may limit tolerance of new medications. She is considering a low-carb diet to support weight loss and improve metabolic health.  She denies chronic bowel habit changes but notes occasional days without a bowel movement. She recently had transient foot swelling that resolved after taking hydrochlorothiazide  and lisinopril  as prescribed. She denies neck masses or trouble swallowing.  She has mildly elevated cholesterol and is using red yeast rice with Coenzyme Q10 for lipid  control.       08/23/2023   11:10 AM 11/14/2021    3:10 PM 07/18/2021    9:51 AM  Depression screen PHQ 2/9  Decreased Interest 0 1 1  Down, Depressed, Hopeless 1 1 1   PHQ - 2 Score 1 2 2   Altered sleeping 0 1 0  Tired, decreased energy 1 1 2   Change in appetite 1 1 2   Feeling bad or failure about yourself  0 0 0  Trouble concentrating 0 0 0  Moving slowly or fidgety/restless 0 0 0  Suicidal thoughts 0 0 0  PHQ-9 Score 3  5  6    Difficult doing work/chores Not difficult at all Not difficult at all Not difficult at all     Data saved with a previous flowsheet row definition      08/23/2023   11:10 AM  GAD 7 : Generalized Anxiety Score  Nervous, Anxious, on Edge 1  Control/stop worrying 1  Worry too much - different things 1  Trouble relaxing 1  Restless 0  Easily annoyed or irritable 0  Afraid - awful might happen 0  Total GAD 7 Score 4  Anxiety Difficulty Not difficult at all    Medications: Outpatient Medications Prior to Visit  Medication Sig   amLODipine  (NORVASC ) 5 MG tablet TAKE 1 TABLET BY MOUTH EVERY DAY   citalopram  (CELEXA ) 10 MG tablet TAKE 1 TABLET BY MOUTH EVERY DAY (Patient taking differently: TAKE 1 TABLET BY MOUTH EVERY DAY)   lisinopril -hydrochlorothiazide  (ZESTORETIC ) 20-12.5 MG tablet TAKE 1 TABLET BY MOUTH EVERY DAY   metoprolol  succinate (TOPROL -XL) 100 MG 24  hr tablet TAKE 1 TABLET BY MOUTH EVERY DAY WITH MEAL   metoprolol  succinate (TOPROL -XL) 25 MG 24 hr tablet TAKE 1 TABLET(25 MG) BY MOUTH DAILY   pantoprazole  (PROTONIX ) 40 MG tablet Take 1 tablet (40 mg total) by mouth daily.   Vitamin D , Ergocalciferol , (DRISDOL ) 1.25 MG (50000 UNIT) CAPS capsule Take 1 capsule (50,000 Units total) by mouth every 7 (seven) days.   No facility-administered medications prior to visit.    Review of Systems All negative Except see HPI       Objective    BP 134/79 (BP Location: Right Arm, Patient Position: Sitting, Cuff Size: Large)   Pulse 61   Temp 98.2  F (36.8 C) (Oral)   Ht 5' 7 (1.702 m)   Wt 214 lb (97.1 kg)   LMP 03/19/2017 (Approximate) Comment: Preg Test Negative  SpO2 100%   BMI 33.52 kg/m     Physical Exam Vitals reviewed.  Constitutional:      General: She is not in acute distress.    Appearance: Normal appearance. She is well-developed. She is obese. She is not diaphoretic.  HENT:     Head: Normocephalic and atraumatic.  Eyes:     General: No scleral icterus.    Conjunctiva/sclera: Conjunctivae normal.  Neck:     Thyroid: No thyromegaly.  Cardiovascular:     Rate and Rhythm: Normal rate and regular rhythm.     Pulses: Normal pulses.     Heart sounds: Normal heart sounds. No murmur heard. Pulmonary:     Effort: Pulmonary effort is normal. No respiratory distress.     Breath sounds: Normal breath sounds. No wheezing, rhonchi or rales.  Musculoskeletal:     Cervical back: Neck supple.     Right lower leg: No edema.     Left lower leg: No edema.  Lymphadenopathy:     Cervical: No cervical adenopathy.  Skin:    General: Skin is warm and dry.     Findings: No rash.  Neurological:     Mental Status: She is alert and oriented to person, place, and time. Mental status is at baseline.  Psychiatric:        Mood and Affect: Mood normal.        Behavior: Behavior normal.      No results found for any visits on 02/21/24.       Assessment & Plan Obesity Chronic and improving Body mass index is 33.52 kg/m. Associated with HTN, prediabetes, hld. Emphasized gradual weight loss to prevent complications. Discussed benefits of structured programs and lifestyle changes. - Encouraged 5-10% weight loss over 3-6 months. - Consider structured weight loss programs like Weight Watchers. - Encouraged healthy eating habits, including smoothies and low-carb diet. - Scheduled follow-up in two months.  Hypertension Chronic and unstable Blood pressure above target, indicating stage 1 hypertension. Discussed weight loss  impact, salt reduction, and stress management. - Continue amlodipine , lisinopril , hydrochlorothiazide , and metoprolol . - Encouraged reduction of salt intake. Will follow-up  Prediabetes Chronic and stable Discussed metformin  use with caution for gastrointestinal side effects. Emphasized dietary changes for blood sugar management. - Consider starting metformin  500 mg daily, with caution for gastrointestinal side effects. Advised to start taking B12 while taking metformin . - Encouraged low-carb diet as per American Diabetic Association guidelines. Will follow-up  Hypercholesteremia Chronic and stable Cholesterol slightly elevated, low risk for heart attack and stroke. Discussed red yeast rice and CoQ10 supplementation. - Continue red yeast rice supplementation. - Consider CoQ10 supplementation with red  yeast rice. Continue low cholesterol diet and daily exercise Will follow-up  Irritable Bowel Syndrome Chronic and stable IBS may be exacerbated by metformin . Will reassess after a trial of metformin  Will FU  General Health Maintenance Discussed importance of physical activity and healthy eating. Encouraged community participation for support. - Promoted healthy eating habits, including smoothies and low-carb diet.  Prediabetes (Primary)  - metFORMIN  (GLUCOPHAGE -XR) 500 MG 24 hr tablet; Take 1 tablet (500 mg total) by mouth daily with breakfast.  Dispense: 30 tablet; Refill: 2  No orders of the defined types were placed in this encounter.   No follow-ups on file.   The patient was advised to call back or seek an in-person evaluation if the symptoms worsen or if the condition fails to improve as anticipated.  I discussed the assessment and treatment plan with the patient. The patient was provided an opportunity to ask questions and all were answered. The patient agreed with the plan and demonstrated an understanding of the instructions.  I, Kenesha Moshier, PA-C have reviewed all  documentation for this visit. The documentation on 02/21/2024  for the exam, diagnosis, procedures, and orders are all accurate and complete.  Jolynn Spencer, [facility], MMS [facility] [phone] (phone) [phone] (fax)  [facility]

## 2024-02-23 DIAGNOSIS — K589 Irritable bowel syndrome without diarrhea: Secondary | ICD-10-CM | POA: Insufficient documentation

## 2024-03-17 ENCOUNTER — Other Ambulatory Visit: Payer: Self-pay | Admitting: Physician Assistant

## 2024-03-17 DIAGNOSIS — I10 Essential (primary) hypertension: Secondary | ICD-10-CM

## 2024-03-26 ENCOUNTER — Encounter: Payer: Self-pay | Admitting: Physician Assistant

## 2024-04-08 ENCOUNTER — Other Ambulatory Visit: Payer: Self-pay | Admitting: Physician Assistant

## 2024-04-08 DIAGNOSIS — I10 Essential (primary) hypertension: Secondary | ICD-10-CM

## 2024-04-09 ENCOUNTER — Other Ambulatory Visit: Payer: Self-pay | Admitting: Physician Assistant

## 2024-04-09 DIAGNOSIS — I10 Essential (primary) hypertension: Secondary | ICD-10-CM

## 2024-04-24 ENCOUNTER — Ambulatory Visit: Admitting: Physician Assistant

## 2024-04-24 DIAGNOSIS — E7800 Pure hypercholesterolemia, unspecified: Secondary | ICD-10-CM

## 2024-04-24 DIAGNOSIS — Z1159 Encounter for screening for other viral diseases: Secondary | ICD-10-CM

## 2024-04-24 DIAGNOSIS — Z23 Encounter for immunization: Secondary | ICD-10-CM

## 2024-04-24 DIAGNOSIS — E669 Obesity, unspecified: Secondary | ICD-10-CM

## 2024-04-24 DIAGNOSIS — E559 Vitamin D deficiency, unspecified: Secondary | ICD-10-CM

## 2024-04-24 DIAGNOSIS — F419 Anxiety disorder, unspecified: Secondary | ICD-10-CM

## 2024-04-24 DIAGNOSIS — R7303 Prediabetes: Secondary | ICD-10-CM

## 2024-08-24 ENCOUNTER — Encounter: Admitting: Physician Assistant
